# Patient Record
Sex: Female | Born: 1965 | Race: White | Hispanic: No | Smoking: Former smoker
Health system: Southern US, Community
[De-identification: ages and names within clinical notes are randomized; demographics above are authoritative.]

## PROBLEM LIST (undated history)

## (undated) DIAGNOSIS — Z87442 Personal history of urinary calculi: Secondary | ICD-10-CM

## (undated) DIAGNOSIS — K219 Gastro-esophageal reflux disease without esophagitis: Secondary | ICD-10-CM

## (undated) DIAGNOSIS — M5136 Other intervertebral disc degeneration, lumbar region: Secondary | ICD-10-CM

## (undated) DIAGNOSIS — E785 Hyperlipidemia, unspecified: Secondary | ICD-10-CM

## (undated) DIAGNOSIS — D122 Benign neoplasm of ascending colon: Secondary | ICD-10-CM

## (undated) DIAGNOSIS — I209 Angina pectoris, unspecified: Secondary | ICD-10-CM

## (undated) DIAGNOSIS — M51369 Other intervertebral disc degeneration, lumbar region without mention of lumbar back pain or lower extremity pain: Secondary | ICD-10-CM

## (undated) DIAGNOSIS — T7840XA Allergy, unspecified, initial encounter: Secondary | ICD-10-CM

## (undated) DIAGNOSIS — R0789 Other chest pain: Secondary | ICD-10-CM

## (undated) DIAGNOSIS — E7800 Pure hypercholesterolemia, unspecified: Secondary | ICD-10-CM

## (undated) HISTORY — PX: ABDOMINAL HYSTERECTOMY: SHX81

## (undated) HISTORY — DX: Pure hypercholesterolemia, unspecified: E78.00

## (undated) HISTORY — DX: Allergy, unspecified, initial encounter: T78.40XA

---

## 1998-05-25 HISTORY — PX: ABDOMINAL HYSTERECTOMY: SHX81

## 2011-07-07 ENCOUNTER — Ambulatory Visit: Payer: Self-pay | Admitting: Family Medicine

## 2011-07-16 ENCOUNTER — Ambulatory Visit: Payer: Self-pay | Admitting: Family Medicine

## 2012-01-07 ENCOUNTER — Ambulatory Visit: Payer: Self-pay | Admitting: Family Medicine

## 2012-01-14 ENCOUNTER — Ambulatory Visit: Payer: Self-pay

## 2012-01-14 HISTORY — PX: BREAST BIOPSY: SHX20

## 2012-07-13 ENCOUNTER — Ambulatory Visit: Payer: Self-pay

## 2013-03-03 ENCOUNTER — Encounter: Payer: Self-pay | Admitting: General Surgery

## 2013-03-08 ENCOUNTER — Other Ambulatory Visit: Payer: PRIVATE HEALTH INSURANCE

## 2013-03-08 ENCOUNTER — Ambulatory Visit (INDEPENDENT_AMBULATORY_CARE_PROVIDER_SITE_OTHER): Payer: PRIVATE HEALTH INSURANCE | Admitting: General Surgery

## 2013-03-08 ENCOUNTER — Encounter: Payer: Self-pay | Admitting: General Surgery

## 2013-03-08 VITALS — BP 104/64 | HR 60 | Resp 12 | Ht 63.0 in | Wt 169.0 lb

## 2013-03-08 DIAGNOSIS — N63 Unspecified lump in unspecified breast: Secondary | ICD-10-CM

## 2013-03-08 NOTE — Patient Instructions (Addendum)
Continue self breast exams. Call office for any new breast issues or concerns. Left mammogram and follow up visit.  Patient has been scheduled for a unilateral left breast diagnostic mammogram at [facility] on 03-10-13 at 8:45 am (ok to arrange per Blima Singer, BCCCP). She is aware of date, time, and instructions. If mammogram is normal, we will have patient to return to the office in two months for follow up.

## 2013-03-08 NOTE — Progress Notes (Signed)
Patient ID: [name], female   DOB: Sep 16, 1965, 47 y.o.   MRN: [id number]  Chief Complaint  Patient presents with  . Breast Problem    lump in left breast    HPI [name] is a 47 y.o. female.  who presents for a breast evaluation. The most recent mammogram was done on Feb 2014.  Patient does perform regular self breast checks and gets regular mammograms done.  She has noticed a lump in left breast for about one month ago that has not changed in size.  Denies pain and no breast trauma.  HPI  Past Medical History  Diagnosis Date  . High cholesterol     Past Surgical History  Procedure Laterality Date  . Breast biopsy Right 01-14-12    History reviewed. No pertinent family history.  Social History History  Substance Use Topics  . Smoking status: Current Every Day Smoker -- 1.00 packs/day for 30 years  . Smokeless tobacco: Never Used  . Alcohol Use: Yes    No Known Allergies  Current Outpatient Prescriptions  Medication Sig Dispense Refill  . aspirin 81 MG tablet Take 81 mg by mouth daily.      Marland Kitchen atorvastatin (LIPITOR) 20 MG tablet Take 20 mg by mouth daily.      . Fish Oil-Cholecalciferol (FISH OIL + D3 PO) Take 1 tablet by mouth daily.       No current facility-administered medications for this visit.    Review of Systems Review of Systems  Constitutional: Negative.   Respiratory: Negative.   Cardiovascular: Negative.     Blood pressure 104/64, pulse 60, resp. rate 12, height 5\' 3"  (1.6 m), weight 169 lb (76.658 kg).  Physical Exam Physical Exam  Constitutional: She is oriented to person, place, and time. She appears well-developed and well-nourished.  Eyes: Conjunctivae are normal. No scleral icterus.  Neck: Neck supple.  Pulmonary/Chest: Right breast exhibits no inverted nipple, no mass, no nipple discharge, no skin change and no tenderness. Left breast exhibits no inverted nipple, no mass, no nipple discharge, no skin change and no tenderness.     Lymphadenopathy:    She has no cervical adenopathy.    She has no axillary adenopathy.  Neurological: She is alert and oriented to person, place, and time.  Skin: Skin is warm and dry.    Data Reviewed Prior pathology from right breast 01-14-12 biopsy.  Assessment    Upper inner quadrant left breast with mild thickening but no defined mass..  Patient points to a location in the midst of this area where she felt a tiny knot, this is not appreciable on examine today.  US done today shows 2 rounded hypo to anechoic masses , 3 and 4 mm size at 10-11 o'cl mid UIQ.  These lekey are benign and can be followed. A left mammogram is indicated. If ok will recheck in 2 mos.  Plan    Left mammogram and follow up visit.     Patient has been scheduled for a unilateral left breast diagnostic mammogram at [facility] on Friday, 03-10-13 at 8:45 am (ok to arrange per Blima Singer, BCCCP). She is aware of date, time, and instructions. If mammogram is normal, we will have patient to return to the office in two months for follow up.    Oliviarose Punch G 03/08/2013, 1:09 PM

## 2013-03-29 ENCOUNTER — Encounter: Payer: Self-pay | Admitting: General Surgery

## 2013-03-29 ENCOUNTER — Ambulatory Visit: Payer: Self-pay | Admitting: General Surgery

## 2013-05-09 ENCOUNTER — Encounter: Payer: Self-pay | Admitting: General Surgery

## 2013-05-09 ENCOUNTER — Other Ambulatory Visit: Payer: PRIVATE HEALTH INSURANCE

## 2013-05-09 ENCOUNTER — Ambulatory Visit (INDEPENDENT_AMBULATORY_CARE_PROVIDER_SITE_OTHER): Payer: PRIVATE HEALTH INSURANCE | Admitting: General Surgery

## 2013-05-09 VITALS — BP 104/70 | HR 64 | Resp 12 | Ht 63.0 in | Wt 168.0 lb

## 2013-05-09 DIAGNOSIS — N63 Unspecified lump in unspecified breast: Secondary | ICD-10-CM

## 2013-05-09 NOTE — Patient Instructions (Signed)
Continue monthly self breast exam. Call for any concerns.

## 2013-05-09 NOTE — Progress Notes (Addendum)
Patient ID: [name], female   DOB: [birth year], 47 y.o.   MRN: [id number]  Chief Complaint  Patient presents with  . Follow-up    2 month follow up breast mass    HPI [name] is a 47 y.o. female who presents for a 2 month follow up of a breast mass. The mass is located in the left breast. The patient checks her breasts regularly and gets regular mammograms done. She denies any new problems with her breasts at this time.  She still feels a tiny lump in left breast. HPI  Past Medical History  Diagnosis Date  . High cholesterol   . Breast mass 2014    Past Surgical History  Procedure Laterality Date  . Breast biopsy Right 01-14-12    History reviewed. No pertinent family history.  Social History History  Substance Use Topics  . Smoking status: Current Every Day Smoker -- 1.00 packs/day for 30 years  . Smokeless tobacco: Never Used  . Alcohol Use: Yes    No Known Allergies  Current Outpatient Prescriptions  Medication Sig Dispense Refill  . aspirin 81 MG tablet Take 81 mg by mouth daily.      Marland Kitchen atorvastatin (LIPITOR) 20 MG tablet Take 20 mg by mouth daily.      . Fish Oil-Cholecalciferol (FISH OIL + D3 PO) Take 1 tablet by mouth daily.       No current facility-administered medications for this visit.    Review of Systems Review of Systems  Constitutional: Negative.   Respiratory: Negative.   Cardiovascular: Negative.     Blood pressure 104/70, pulse 64, resp. rate 12, height 5\' 3"  (1.6 m), weight 168 lb (76.204 kg).  Physical Exam Physical Exam  Constitutional: She is oriented to person, place, and time. She appears well-developed and well-nourished.  Eyes: No scleral icterus.  Pulmonary/Chest: Right breast exhibits no inverted nipple, no mass, no nipple discharge, no skin change and no tenderness. Left breast exhibits mass (1 focal 4 mm thickening  at 11o'clock left breast  3cm from nipple ). Left breast exhibits no inverted nipple, no nipple discharge, no  skin change and no tenderness. Breasts are symmetrical.  Lymphadenopathy:    She has no cervical adenopathy.    She has no axillary adenopathy.  Neurological: She is alert and oriented to person, place, and time.  Skin: Skin is warm and dry.    Data Reviewed Mammogram reviewed-normal. [place] repeated today at 10 and 11 o'cl location. 2 benign appearing rounded nodules seen as noted 2 mos ago. No change in sizes-67mm at 10 ocl and 3 mm at 11 ocl.  Assessment    Stable small nodules in left breast    Plan    Patient to return in three month for left breast u/s-possible biopsy if any changes noted .        [name] G 05/09/2013, 7:06 PM

## 2013-05-31 ENCOUNTER — Ambulatory Visit: Payer: Self-pay

## 2013-06-05 ENCOUNTER — Encounter: Payer: Self-pay | Admitting: General Surgery

## 2013-06-05 ENCOUNTER — Ambulatory Visit (INDEPENDENT_AMBULATORY_CARE_PROVIDER_SITE_OTHER): Payer: PRIVATE HEALTH INSURANCE | Admitting: General Surgery

## 2013-06-05 ENCOUNTER — Other Ambulatory Visit: Payer: PRIVATE HEALTH INSURANCE

## 2013-06-05 VITALS — BP 124/76 | HR 72 | Resp 12 | Ht 63.0 in | Wt 169.0 lb

## 2013-06-05 DIAGNOSIS — N63 Unspecified lump in unspecified breast: Secondary | ICD-10-CM

## 2013-06-05 NOTE — Progress Notes (Signed)
Patient ID: [name], female   DOB: [birth year], 48 y.o.   MRN: [id number]  Chief Complaint  Patient presents with  . Follow-up    left breast     HPI [name] is a 48 y.o. female here today for a left breast ultrasound.  HPI  Past Medical History  Diagnosis Date  . High cholesterol   . Breast mass 2014    Past Surgical History  Procedure Laterality Date  . Breast biopsy Right 01-14-12    History reviewed. No pertinent family history.  Social History History  Substance Use Topics  . Smoking status: Current Every Day Smoker -- 1.00 packs/day for 30 years  . Smokeless tobacco: Never Used  . Alcohol Use: Yes    No Known Allergies  Current Outpatient Prescriptions  Medication Sig Dispense Refill  . aspirin 81 MG tablet Take 81 mg by mouth daily.      Marland Kitchen atorvastatin (LIPITOR) 20 MG tablet Take 20 mg by mouth daily.      . Fish Oil-Cholecalciferol (FISH OIL + D3 PO) Take 1 tablet by mouth daily.       No current facility-administered medications for this visit.    Review of Systems Review of Systems  Constitutional: Negative.   Respiratory: Negative.   Cardiovascular: Negative.     Blood pressure 124/76, pulse 72, resp. rate 12, height 5\' 3"  (1.6 m), weight 169 lb (76.658 kg).  Physical Exam Physical Exam  Constitutional: She is oriented to person, place, and time. She appears well-developed and well-nourished.  Eyes: Conjunctivae are normal. No scleral icterus.  Neck: Neck supple.  Pulmonary/Chest: Right breast exhibits no inverted nipple, no mass, no nipple discharge, no skin change and no tenderness. Left breast exhibits no inverted nipple, no nipple discharge, no skin change and no tenderness.  Lymphadenopathy:    She has no cervical adenopathy.    She has no axillary adenopathy.  Neurological: She is alert and oriented to person, place, and time.  Skin: Skin is warm and dry.    Data Reviewed Mammogram reviewed   Assessment     performed left   ultrasound shows stable nodules  at 10 and 11 o'clock. Plan    Patient to return in 6 months left breast ultrasound and exam.       Cherylann Hobday G 06/07/2013, 7:40 AM

## 2013-06-05 NOTE — Patient Instructions (Signed)
Patient to return in 6  Months left breast ultrasound and exam. Patient to continue to do self breast chest monthly.

## 2013-06-07 ENCOUNTER — Encounter: Payer: Self-pay | Admitting: General Surgery

## 2013-08-07 ENCOUNTER — Ambulatory Visit: Payer: PRIVATE HEALTH INSURANCE | Admitting: General Surgery

## 2013-08-20 IMAGING — MG MM ADDITIONAL VIEWS AT NO CHARGE
1 series · 7 of 7 positions shown · non-contrast
Comparison: none

REASON FOR EXAM: av bilat nodular densities
COMMENTS:

PROCEDURE:     MAM - MAM DGTL  ADD VW  BIL SCR  - July 16, 2011 [DATE]
RESULT:

[R ML · right · 7 of 7 slices shown]
[im 1/7]
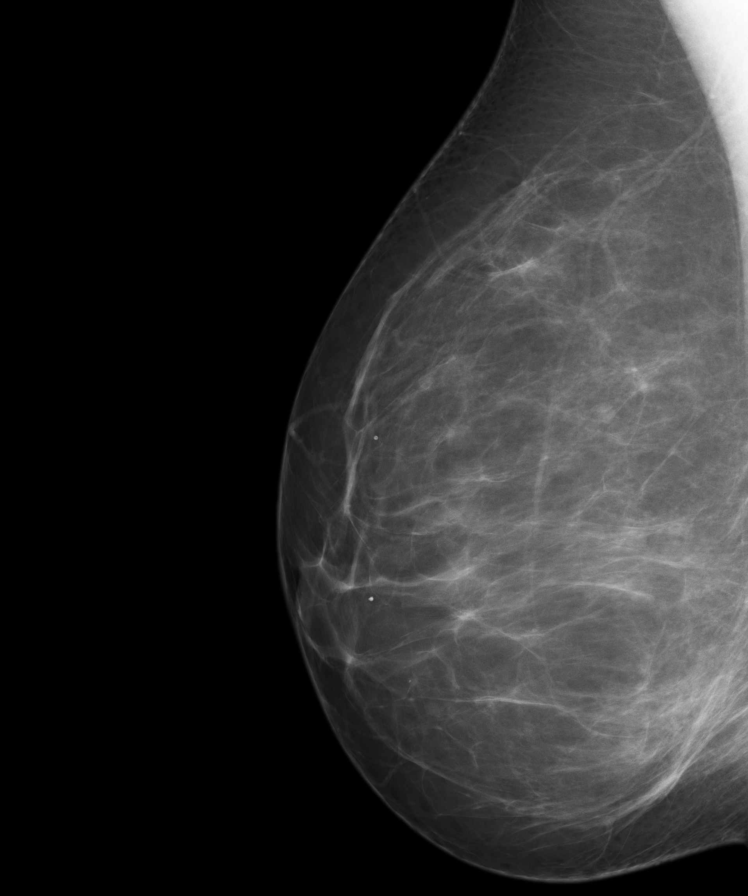
[im 2/7]
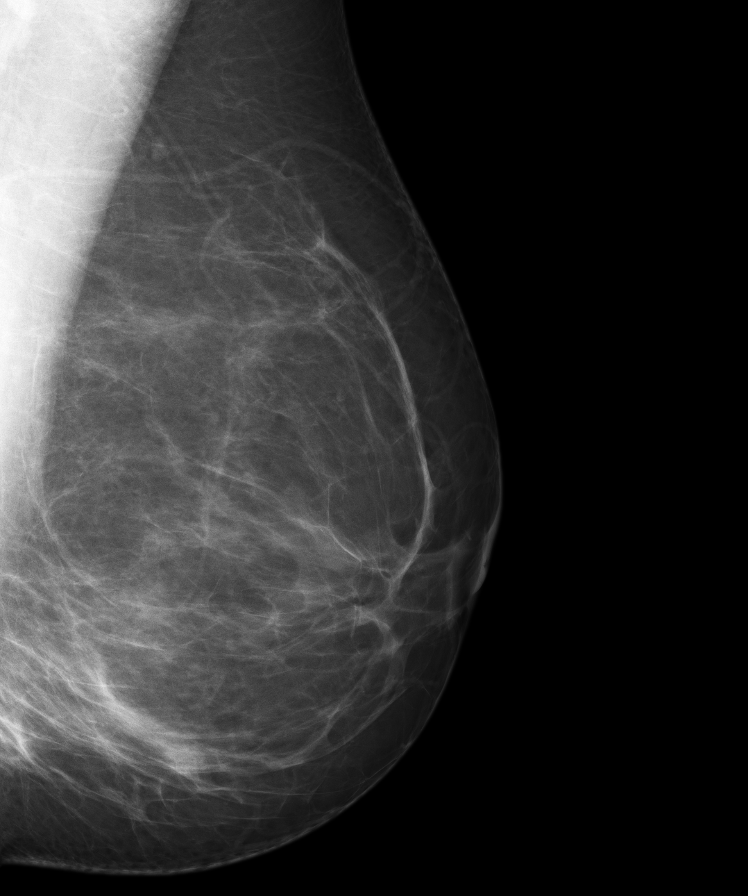
[im 3/7]
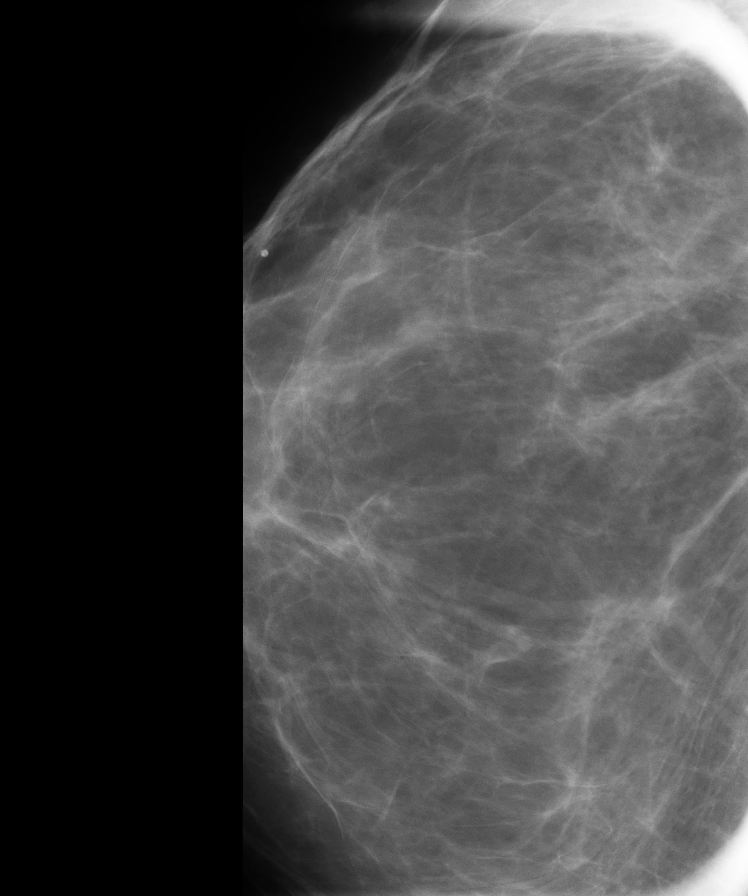
[im 4/7]
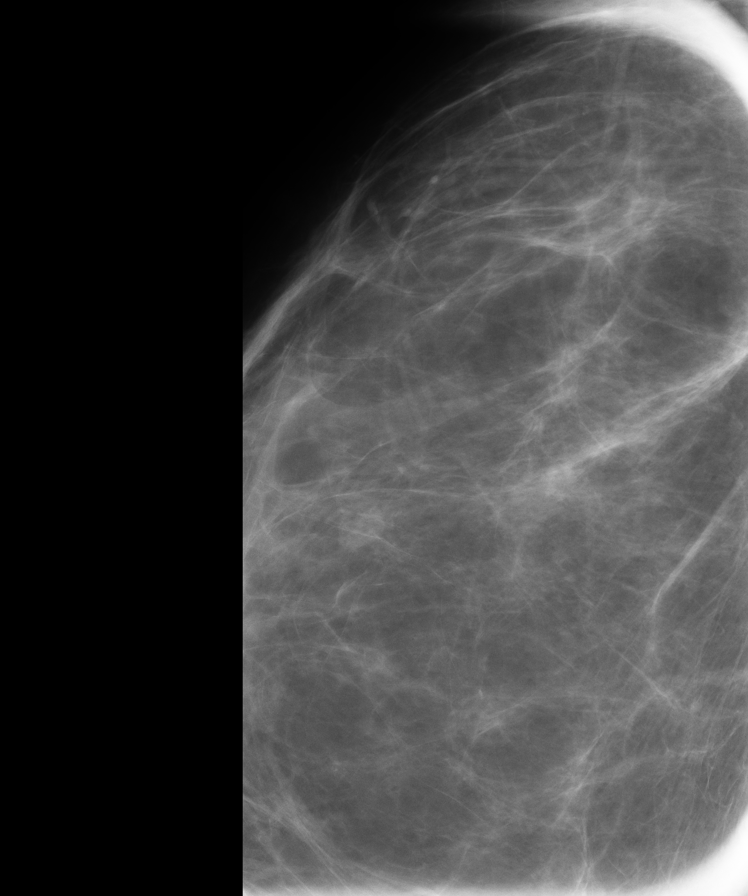
[im 5/7]
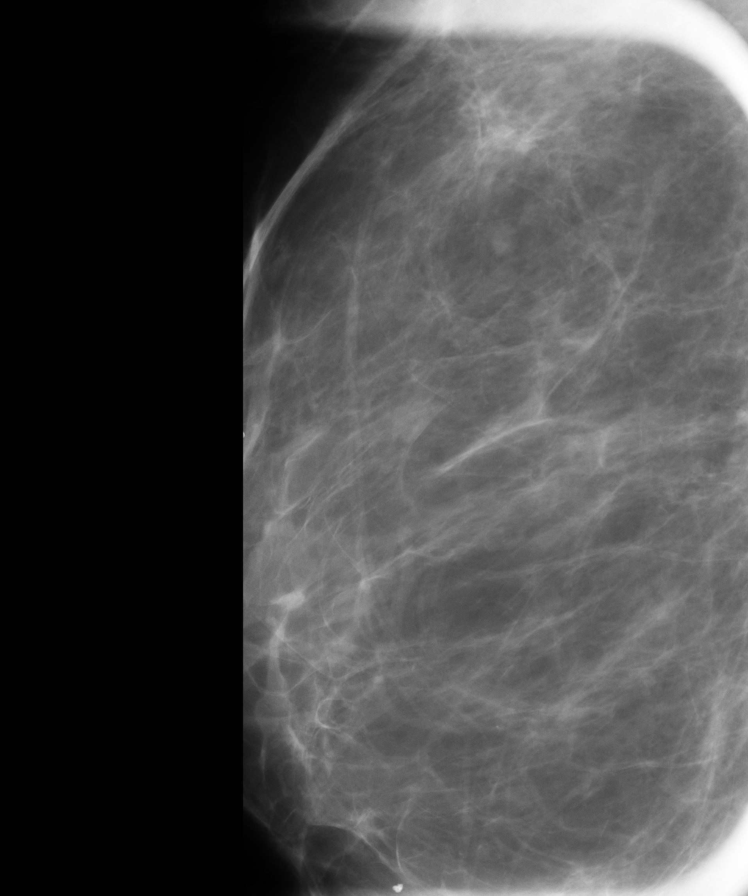
[im 6/7]
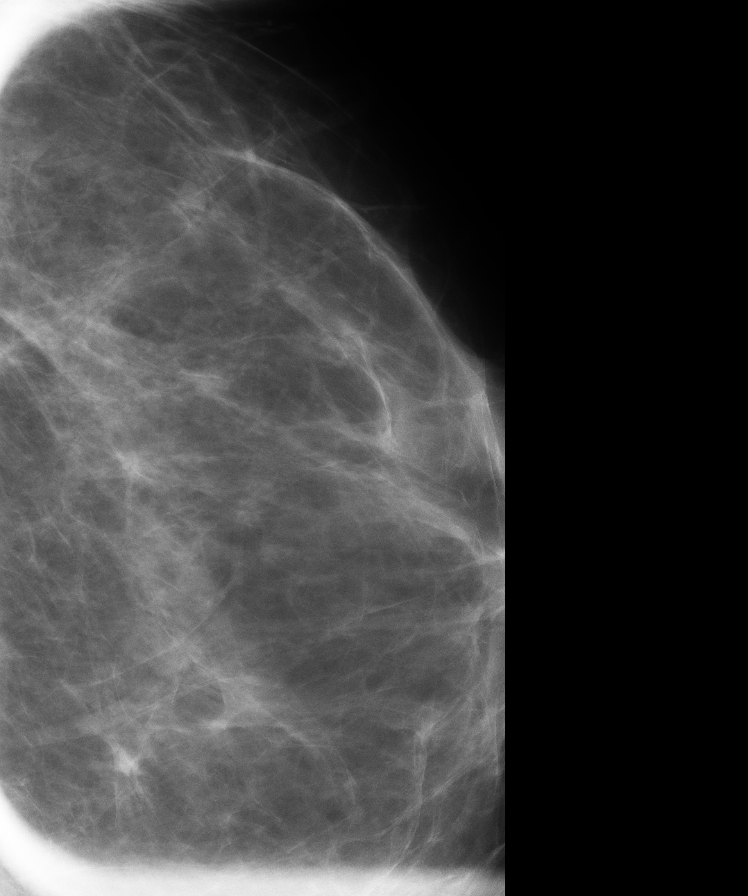
[im 7/7]
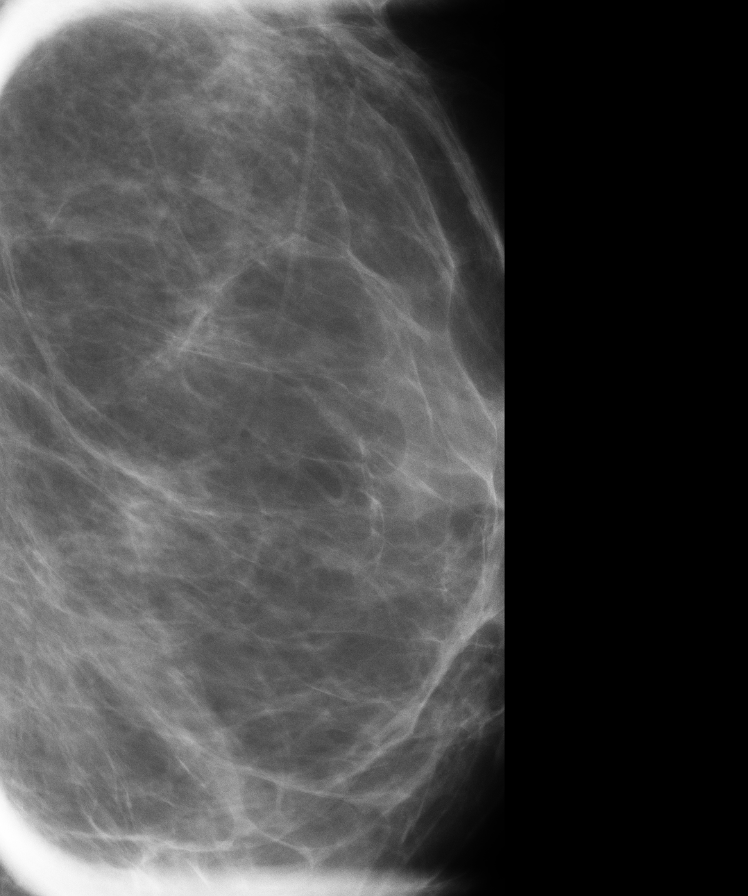

[7 of 7 positions shown; findings below may reference images not displayed]

FINDINGS: The region of interest within the left breast is further evaluated
with magnification compression imaging. This area effaces with compression.

The region of interest within the right breast was further evaluated and
appears to persist. This area is small and demonstrates primarily smoothly
marginated borders. Sonographic evaluation of this region demonstrates
sonographic findings which are too small to characterize though likely
represent a small, benign cyst. There is no further radiographic or
sonographic evidence to suggest malignancy.
IMPRESSION: BI-RADS: Category 3 - Probably Benign Finding (interval follow-up).

RECOMMENDATION:  Likely benign finding, right breast. Six month
re-evaluation recommended.

Thank you for this opportunity to contribute to the care of your patient.

A NEGATIVE MAMMOGRAM REPORT DOES NOT PRECLUDE BIOPSY OR OTHER EVALUATION OF
A CLINICALLY PALPABLE OR OTHERWISE SUSPICIOUS MASS OR LESION. BREAST CANCER
MAY NOT BE DETECTED BY MAMMOGRAPHY IN UP TO 10% OF CASES.

## 2013-10-19 ENCOUNTER — Ambulatory Visit (INDEPENDENT_AMBULATORY_CARE_PROVIDER_SITE_OTHER): Payer: PRIVATE HEALTH INSURANCE | Admitting: General Surgery

## 2013-10-19 ENCOUNTER — Encounter: Payer: Self-pay | Admitting: General Surgery

## 2013-10-19 ENCOUNTER — Other Ambulatory Visit: Payer: PRIVATE HEALTH INSURANCE

## 2013-10-19 VITALS — BP 130/80 | HR 76 | Resp 14 | Ht 63.0 in | Wt 152.0 lb

## 2013-10-19 DIAGNOSIS — N63 Unspecified lump in unspecified breast: Secondary | ICD-10-CM

## 2013-10-19 NOTE — Patient Instructions (Signed)
Continue self breast exams. Call office for any new breast issues or concerns. 

## 2013-10-19 NOTE — Progress Notes (Signed)
Patient ID: [name], female   DOB: 26-Sep-1965, 48 y.o.   MRN: [id number]  Chief Complaint  Patient presents with  . Follow-up    breast check    HPI [name] is a 48 y.o. female here today for a breast check. Patient states the re is a red spot in her left breast looks like the skin is turning brown . This is in the vicinity of the breast mass seen with [place] before. She states she noticed this about two weeks ago. HPI  Past Medical History  Diagnosis Date  . High cholesterol   . Breast mass 2014    Past Surgical History  Procedure Laterality Date  . Breast biopsy Right 01-14-12    History reviewed. No pertinent family history.  Social History History  Substance Use Topics  . Smoking status: Current Every Day Smoker -- 1.00 packs/day for 30 years  . Smokeless tobacco: Never Used  . Alcohol Use: Yes    No Known Allergies  Current Outpatient Prescriptions  Medication Sig Dispense Refill  . aspirin 81 MG tablet Take 81 mg by mouth daily.      Marland Kitchen atorvastatin (LIPITOR) 20 MG tablet Take 20 mg by mouth daily.      . Fish Oil-Cholecalciferol (FISH OIL + D3 PO) Take 1 tablet by mouth daily.       No current facility-administered medications for this visit.    Review of Systems Review of Systems  Constitutional: Negative.   Respiratory: Negative.   Cardiovascular: Negative.     Blood pressure 130/80, pulse 76, resp. rate 14, height 5\' 3"  (1.6 m), weight 152 lb (68.947 kg).  Physical Exam Physical Exam Their is a 2 mm reddish spot on the skin upper inner left breast. The skin its self is not thickened. The located of this is not near where the lump was seen on ultrasound. No lymphadenopathy in neck or axillae.  Right breast with no findings. Data Reviewed [place] repeated today-no findings at site of new skin spot. The 2 nodules closer to the areola appear unchanged to smaller.  Assessment    Pt reassured-skin spot is not related to the nodules see by US-these appear  stable to smaller.    Plan   Patient to return  In January 2016.        Seeplaputhur G Sankar 10/19/2013, 11:12 AM

## 2014-03-26 ENCOUNTER — Encounter: Payer: Self-pay | Admitting: General Surgery

## 2014-06-18 ENCOUNTER — Ambulatory Visit: Payer: Self-pay

## 2015-04-13 IMAGING — US US OUTSIDE FILMS BREAST
1 series · 11 of 11 positions shown · non-contrast
Comparison: none

[Series 1: us outside films breast · 11 of 11 slices shown]
[im 1/11]
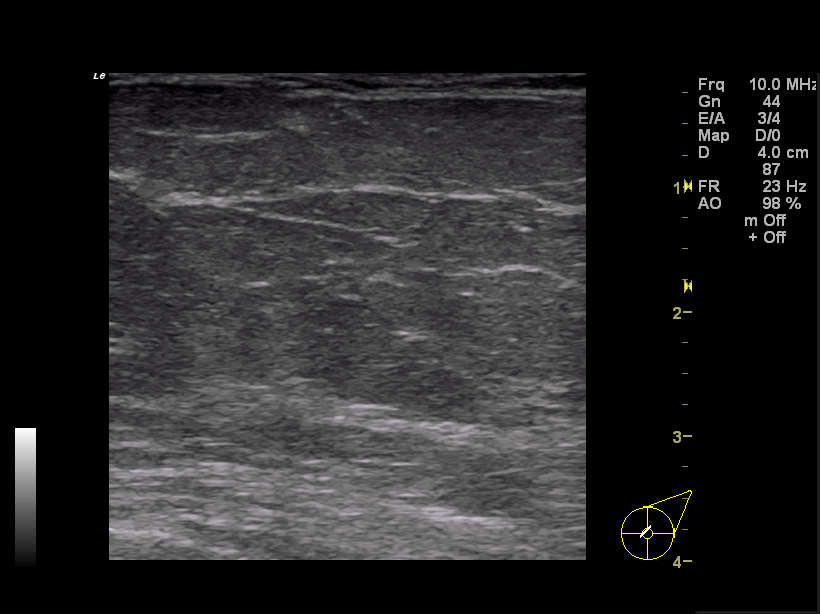
[im 2/11]
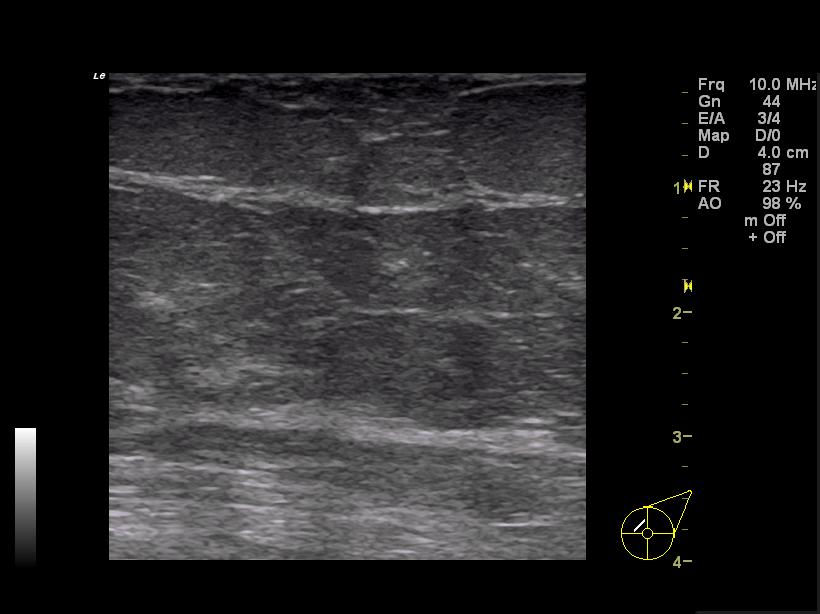
[im 3/11]
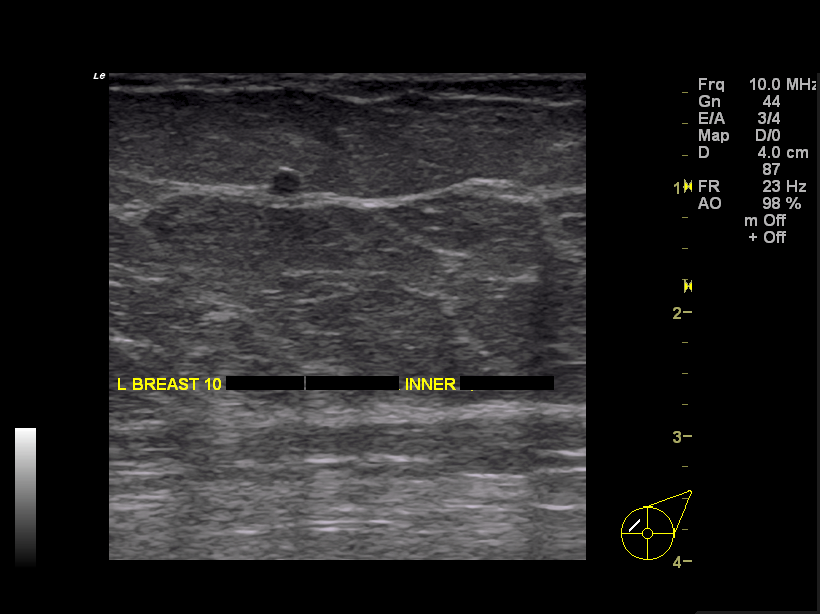
[im 4/11]
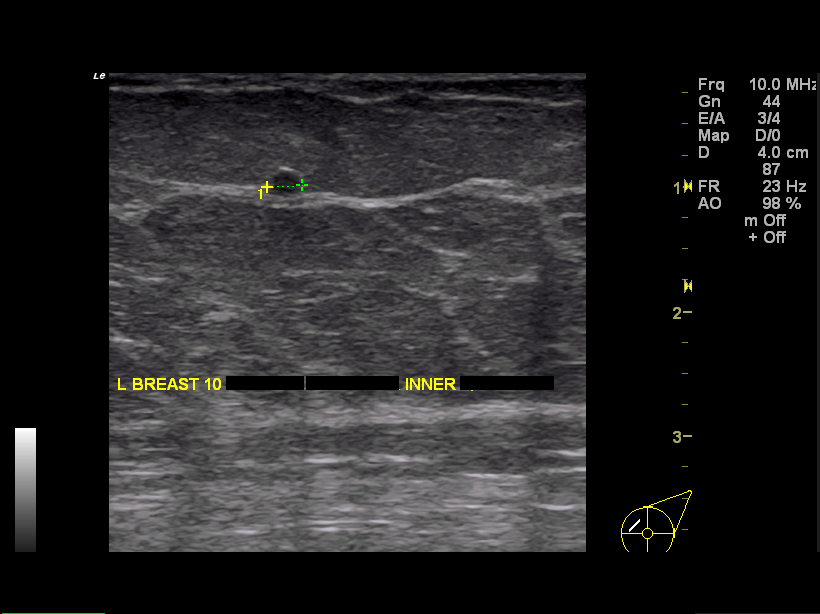
[im 5/11]
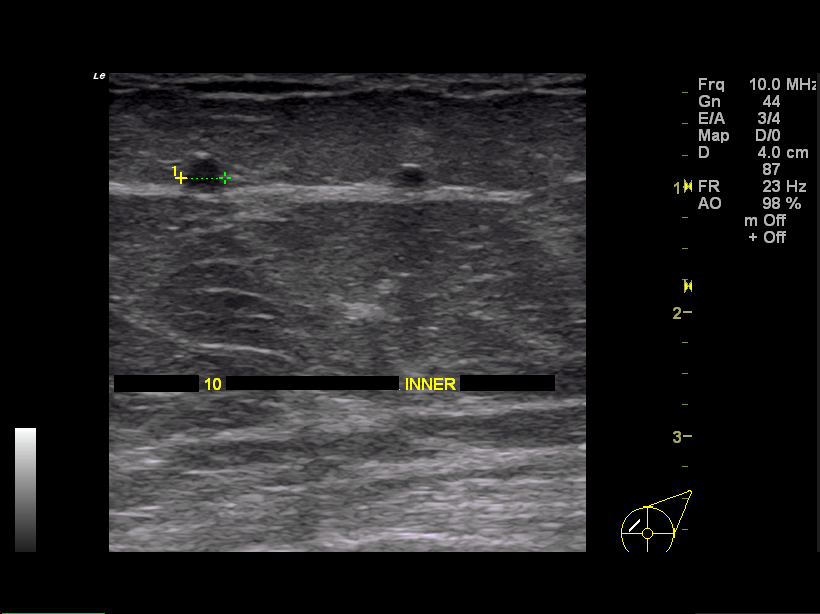
[im 6/11]
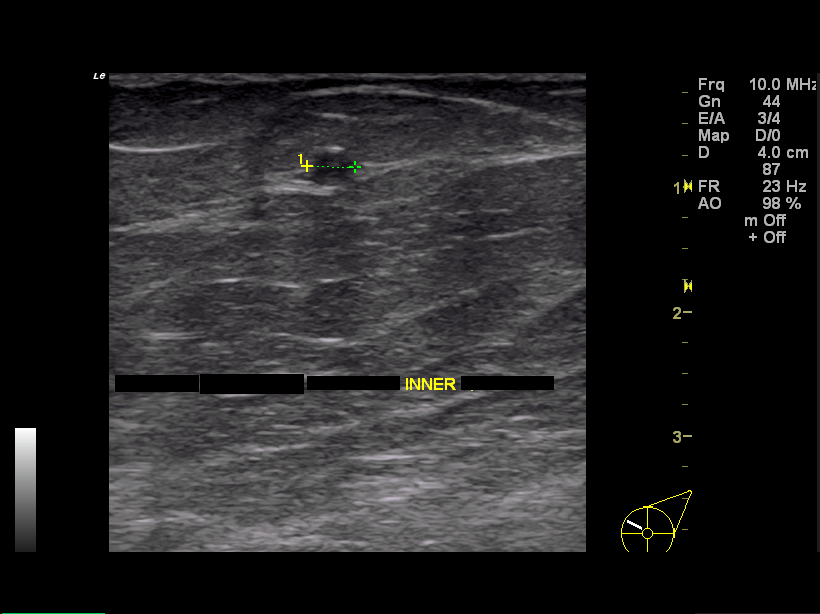
[im 7/11]
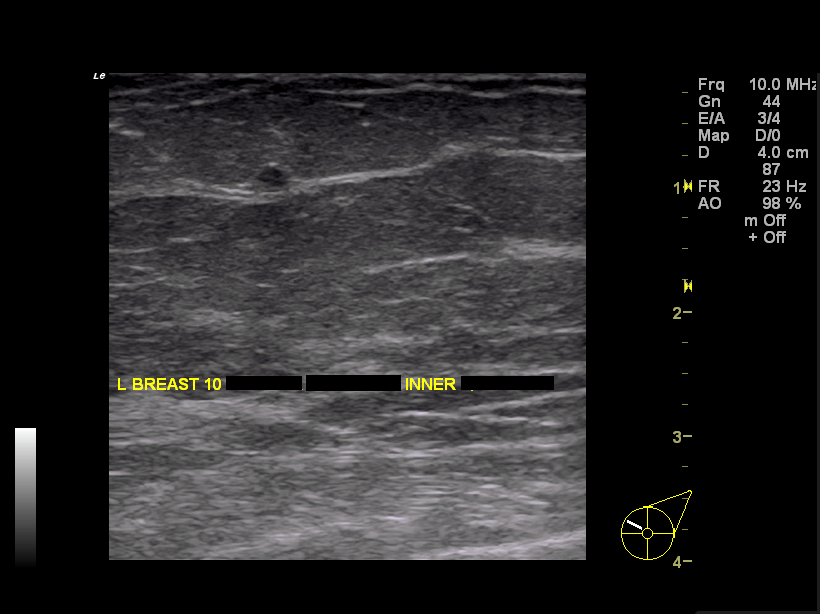
[im 8/11]
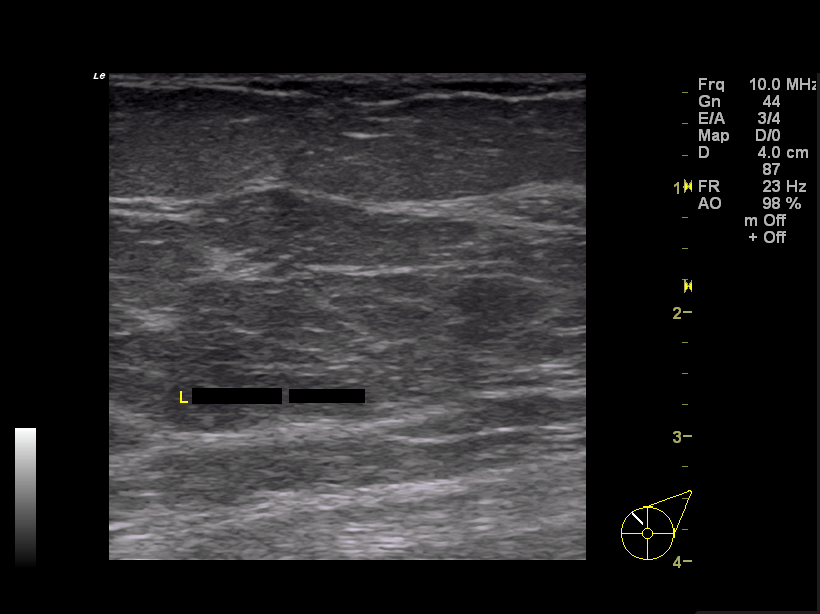
[im 9/11]
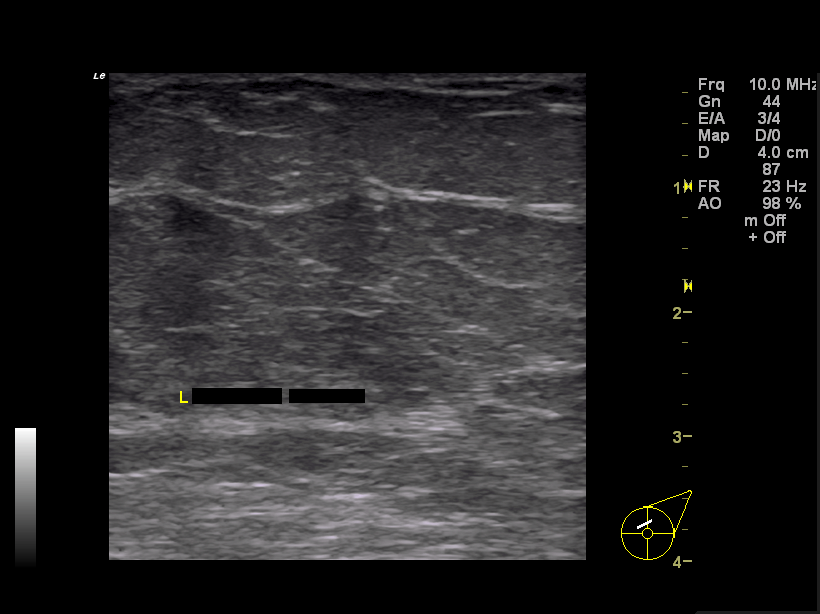
[im 10/11]
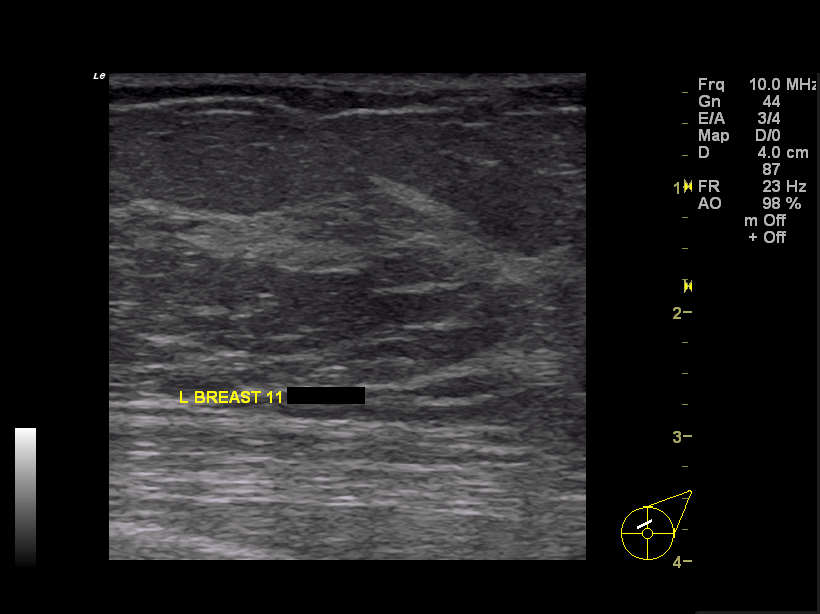
[im 11/11]
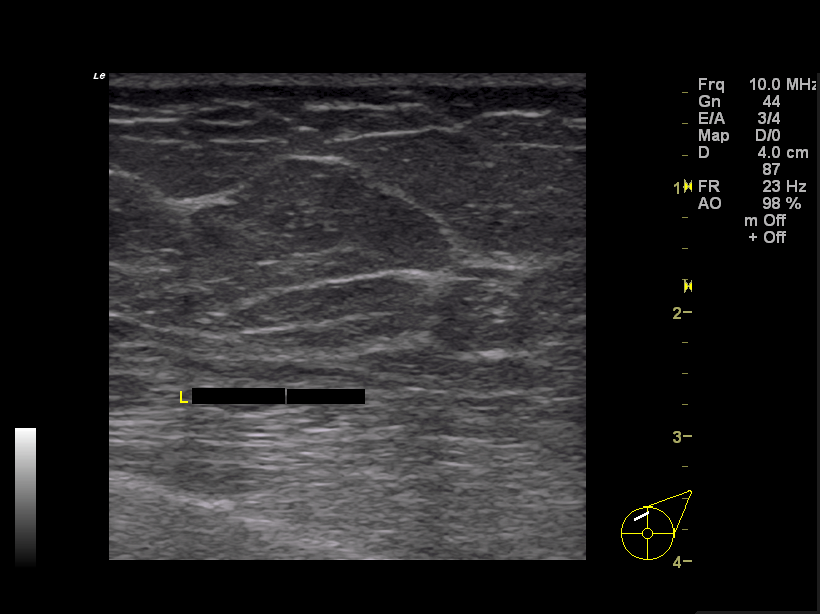

[11 of 11 positions shown; findings below may reference images not displayed]

IMAGES IMPORTED FROM THE SYN[employer] WORKFLOW SYSTEM
NO DICTATION FOR STUDY

## 2016-02-06 ENCOUNTER — Ambulatory Visit (INDEPENDENT_AMBULATORY_CARE_PROVIDER_SITE_OTHER): Payer: Self-pay | Admitting: Family Medicine

## 2016-02-06 ENCOUNTER — Encounter: Payer: Self-pay | Admitting: Family Medicine

## 2016-02-06 VITALS — BP 103/68 | HR 63 | Temp 97.7°F | Resp 16 | Ht 63.0 in | Wt 160.5 lb

## 2016-02-06 DIAGNOSIS — S29012A Strain of muscle and tendon of back wall of thorax, initial encounter: Secondary | ICD-10-CM

## 2016-02-06 MED ORDER — CYCLOBENZAPRINE HCL 5 MG PO TABS: 5.0000 mg | ORAL_TABLET | Freq: Three times a day (TID) | ORAL | 0 refills | Status: DC | PRN

## 2016-02-06 MED ORDER — NAPROXEN 500 MG PO TABS: 500.0000 mg | ORAL_TABLET | Freq: Two times a day (BID) | ORAL | 0 refills | Status: DC

## 2016-02-06 NOTE — Progress Notes (Signed)
Subjective:    Patient ID: [name], female    DOB: [birth year], 50 y.o.   MRN: [id number]  [name] is a 50 y.o. female presenting on 02/06/2016 for Chest Pain (lift up  50 pound chicken and ribs are hurting eversince as per pt)   HPI   RIGHT MID BACK / RIB PAIN: - Reports symptoms started about 6 days ago, working at home putting in a new road by her home, slid and caught herself, seemed to strain her left side of her ribs, but slowly improved. Next day lifted a 50 lb chicken feed and hurt right side of mid back and front ribs and side. Course without significant improvement, without worsening. 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 Improved laying down with heating pad. Tried Ibuprofen 800mg  once daily night time, no other treatments. - Admits to pain worse with deep breaths, some movements turning/twist - Prior history of lumbar disc herniation years ago resolved, no other prior back or muscle injury, no prior back surgery or PT - Denies any fevers/chills, rash, fall or trauma, numbness, tingling, weakness or radiating pain, shortness of breath, cough, bowel or bladder incontinence or loss of control, saddle anesthesia   Social History  Substance Use Topics  . Smoking status: Current Every Day Smoker    Packs/day: 1.00    Years: 30.00  . Smokeless tobacco: Never Used  . Alcohol use Yes    Review of Systems Per HPI unless specifically indicated above     Objective:    BP 103/68   Pulse 63   Temp 97.7 F (36.5 C) (Oral)   Resp 16   Ht 5\' 3"  (1.6 m)   Wt 160 lb 8 oz (72.8 kg)   BMI 28.43 kg/m   Wt Readings from Last 3 Encounters:  02/06/16 160 lb 8 oz (72.8 kg)  10/19/13 152 lb (68.9 kg)  06/05/13 169 lb (76.7 kg)    Physical Exam  Constitutional: She is oriented to person, place, and time. She appears well-developed and well-nourished. No distress.  Well-appearing,  comfortable, cooperative  Neck: Normal range of motion. Neck supple.  Pulmonary/Chest: Effort normal and breath sounds normal. No respiratory distress. She has no wheezes. She has no rales. She exhibits tenderness (Right anterior lower ribs).  Musculoskeletal: She exhibits no edema.  Thoracic / Mid Back Inspection: Normal appearance, no spinal deformity, symmetrical. Palpation: No tenderness over spinous processes. Right thoracic (T4-T10) paraspinal muscles tender and with hypertonicity/spasm. ROM: Mostly full active ROM forward flex / back extension, rotation L/R without significant discomfort Special Testing: Seated SLR negative for radicular pain bilaterally Strength: Bilateral hip flex/ext 5/5, knee flex/ext 5/5, ankle dorsiflex/plantarflex 5/5 Neurovascular: intact distal sensation to light touch  Neurological: She is alert and oriented to person, place, and time.  Gait normal.  Skin: Skin is warm and dry. She is not diaphoretic.  Nursing note and vitals reviewed.      Assessment & Plan:   Problem List Items Addressed This Visit    Muscle strain of right upper back - Primary    Acute x 6 day R thoracic/mid back / anterior rib/chest wall pain without radiation or sciatica. History of chronic low back DJD, prior herniated disc. No prior surgery or complications. - No red flag symptoms. - Improved with heating pad and some therapy, but persistent symptoms  Plan: 1. Start anti-inflammatory trial with rx  Naprosyn 500mg  BID wc x 2 weeks, then PRN 2. Start muscle relaxant with Flexeril 5mg  tabs TID PRN 3. May use Tylenol PRN for breakthrough 4. Encouraged use of heating pad 1-2x daily for now then PRN 5. Demonstrated OMT with Myofascial release of Rib raising, discussed, advised of potential rebound pain, consented, tolerated techniques well with significant improvement. 6. Follow-up as scheduled 1 month for physical, if back not improving or worsening, re-evaluate, consider imaging vs  additional exercises / PT      Relevant Medications   naproxen (NAPROSYN) 500 MG tablet   cyclobenzaprine (FLEXERIL) 5 MG tablet    Other Visit Diagnoses   None.     Meds ordered this encounter  Medications  . naproxen (NAPROSYN) 500 MG tablet    Sig: Take 1 tablet (500 mg total) by mouth 2 (two) times daily with a meal.    Dispense:  30 tablet    Refill:  0  . cyclobenzaprine (FLEXERIL) 5 MG tablet    Sig: Take 1 tablet (5 mg total) by mouth 3 (three) times daily as needed for muscle spasms.    Dispense:  30 tablet    Refill:  0   Follow up plan: Return in about 4 weeks (around 03/05/2016) for back pain / physical.  Nobie Putnam, DO [facility] 02/06/2016, 12:07 PM

## 2016-02-06 NOTE — Assessment & Plan Note (Signed)
Acute x 6 day R thoracic/mid back / anterior rib/chest wall pain without radiation or sciatica. History of chronic low back DJD, prior herniated disc. No prior surgery or complications. - No red flag symptoms. - Improved with heating pad and some therapy, but persistent symptoms  Plan: 1. Start anti-inflammatory trial with rx Naprosyn 500mg  BID wc x 2 weeks, then PRN 2. Start muscle relaxant with Flexeril 5mg  tabs TID PRN 3. May use Tylenol PRN for breakthrough 4. Encouraged use of heating pad 1-2x daily for now then PRN 5. Demonstrated OMT with Myofascial release of Rib raising, discussed, advised of potential rebound pain, consented, tolerated techniques well with significant improvement. 6. Follow-up as scheduled 1 month for physical, if back not improving or worsening, re-evaluate, consider imaging vs additional exercises / PT

## 2016-02-06 NOTE — Patient Instructions (Signed)
Thank you for coming in to clinic today.  1. For your Back Pain - I think that this is due to Muscle Spasms or strain. 2. Start with anti-inflammatory Naprosyn (Naproxen) 500mg  twice daily (12 hrs apart, with food, breakfast and dinner) every day for next 2 to 4 weeks if helping, then can use only as needed 3. Start Cyclobenzapine (Flexeril) 10mg  tablets - cut in half for 5mg  at night for muscle relaxant - may make you sedated or sleepy (be careful driving or working on this) if tolerated you can take every 8 hours, half or whole tab 4. May use Tylenol Extra Str 500mg  tabs - may take 1-2 tablets every 6 hours as needed 5. Recommend to start using heating pad on your lower back 1-2x daily for few weeks  Try the muscle massage technique as demonstrated every other day for a few days then stop  This pain may take weeks to months to fully resolve, but hopefully it will respond to the medicine initially. All back injuries (small or serious) are slow to heal since we use our back muscles every day. Be careful with turning, twisting, lifting, sitting / standing for prolonged periods, and avoid re-injury.  If your symptoms significantly worsen with more pain, or new symptoms with weakness in one or both legs, new or different shooting leg pains, numbness in legs or groin, loss of control or retention of urine or bowel movements, please call back for advice and you may need to go directly to the Emergency Department.  Follow-up as scheduled in October for physical, sooner if needed for back pain.  If you have any other questions or concerns, please feel free to call the clinic or send a message through [facility]. You may also schedule an earlier appointment if necessary.  Nobie Putnam, DO [facility]

## 2016-02-27 ENCOUNTER — Other Ambulatory Visit: Payer: Self-pay

## 2016-02-27 ENCOUNTER — Ambulatory Visit: Payer: PRIVATE HEALTH INSURANCE | Admitting: General Surgery

## 2016-03-02 ENCOUNTER — Other Ambulatory Visit: Payer: Self-pay

## 2016-03-02 ENCOUNTER — Encounter: Payer: Self-pay | Admitting: Family Medicine

## 2016-03-05 ENCOUNTER — Encounter: Payer: Self-pay | Admitting: Family Medicine

## 2016-03-18 ENCOUNTER — Other Ambulatory Visit: Payer: Self-pay

## 2016-04-09 ENCOUNTER — Encounter: Payer: Self-pay | Admitting: Family Medicine

## 2016-04-09 ENCOUNTER — Ambulatory Visit (INDEPENDENT_AMBULATORY_CARE_PROVIDER_SITE_OTHER): Payer: Self-pay | Admitting: General Surgery

## 2016-04-09 ENCOUNTER — Encounter: Payer: Self-pay | Admitting: General Surgery

## 2016-04-09 ENCOUNTER — Ambulatory Visit (INDEPENDENT_AMBULATORY_CARE_PROVIDER_SITE_OTHER): Payer: Self-pay | Admitting: Family Medicine

## 2016-04-09 ENCOUNTER — Inpatient Hospital Stay: Payer: Self-pay

## 2016-04-09 VITALS — BP 128/72 | HR 76 | Resp 14 | Ht 63.0 in | Wt 168.0 lb

## 2016-04-09 VITALS — BP 112/70 | HR 67 | Temp 97.9°F | Resp 16 | Ht 63.0 in | Wt 166.4 lb

## 2016-04-09 DIAGNOSIS — F4320 Adjustment disorder, unspecified: Secondary | ICD-10-CM | POA: Insufficient documentation

## 2016-04-09 DIAGNOSIS — R7309 Other abnormal glucose: Secondary | ICD-10-CM

## 2016-04-09 DIAGNOSIS — Z114 Encounter for screening for human immunodeficiency virus [HIV]: Secondary | ICD-10-CM

## 2016-04-09 DIAGNOSIS — E782 Mixed hyperlipidemia: Secondary | ICD-10-CM

## 2016-04-09 DIAGNOSIS — N6320 Unspecified lump in the left breast, unspecified quadrant: Secondary | ICD-10-CM

## 2016-04-09 DIAGNOSIS — F17211 Nicotine dependence, cigarettes, in remission: Secondary | ICD-10-CM

## 2016-04-09 DIAGNOSIS — Z131 Encounter for screening for diabetes mellitus: Secondary | ICD-10-CM

## 2016-04-09 DIAGNOSIS — E785 Hyperlipidemia, unspecified: Secondary | ICD-10-CM | POA: Insufficient documentation

## 2016-04-09 DIAGNOSIS — F17200 Nicotine dependence, unspecified, uncomplicated: Secondary | ICD-10-CM | POA: Insufficient documentation

## 2016-04-09 DIAGNOSIS — Z0000 Encounter for general adult medical examination without abnormal findings: Secondary | ICD-10-CM

## 2016-04-09 DIAGNOSIS — F4323 Adjustment disorder with mixed anxiety and depressed mood: Secondary | ICD-10-CM

## 2016-04-09 DIAGNOSIS — Z1211 Encounter for screening for malignant neoplasm of colon: Secondary | ICD-10-CM

## 2016-04-09 LAB — COMPLETE METABOLIC PANEL WITH GFR
ALBUMIN: 4.2 g/dL (ref 3.6–5.1)
ALK PHOS: 66 U/L (ref 33–130)
ALT: 26 U/L (ref 6–29)
AST: 22 U/L (ref 10–35)
BUN: 18 mg/dL (ref 7–25)
CALCIUM: 9.3 mg/dL (ref 8.6–10.4)
CO2: 26 mmol/L (ref 20–31)
Chloride: 105 mmol/L (ref 98–110)
Creat: 0.62 mg/dL (ref 0.50–1.05)
Glucose, Bld: 81 mg/dL (ref 65–99)
POTASSIUM: 4.2 mmol/L (ref 3.5–5.3)
Sodium: 141 mmol/L (ref 135–146)
Total Bilirubin: 0.5 mg/dL (ref 0.2–1.2)
Total Protein: 6.7 g/dL (ref 6.1–8.1)

## 2016-04-09 LAB — LIPID PANEL
CHOL/HDL RATIO: 6.1 ratio — AB (ref <5.0)
CHOLESTEROL: 194 mg/dL (ref <200)
HDL: 32 mg/dL — AB (ref >50)
LDL Cholesterol: 120 mg/dL — ABNORMAL HIGH (ref <100)
Triglycerides: 210 mg/dL — ABNORMAL HIGH (ref <150)
VLDL: 42 mg/dL — ABNORMAL HIGH (ref <30)

## 2016-04-09 NOTE — Assessment & Plan Note (Signed)
Reviewed last lipid panel 2016, mild low HDL and inc TG >180, concern with reduced healthy lifestyle currently - Check fasting lipids, follow-up, ASCVD risk, determine if need to resume lipitor or statin therapy

## 2016-04-09 NOTE — Patient Instructions (Addendum)
The patient is aware to call back for any questions or concerns. Patient will be asked to return to the office in 2 months with a bilateral screening mammogram.

## 2016-04-09 NOTE — Patient Instructions (Addendum)
Thank you for coming in to clinic today.  1. We will check all blood work today as discussed, and send you a MyChart message with results and plan, we will discuss if need to resume Lipitor or similar medication.  2. Let me know if you need any further assistance or guidance with regards to any depressive mood or anxiety symptoms, I think a lot of this is very normal process and you seem to be doing well overall.  Sleep Hygiene Recommendations to promote healthy sleep in all patients, especially if symptoms of insomnia are worsening. Due to the nature of sleep rhythms, if your body gets "out of rhythm", it may take some time before your sleep cycle can be "reset".  Please try to follow as many of the following tips as you can, usually there are only a few of these are the primary cause of the problem.  ?To reset your sleep rhythm, go to bed and get up at the same time every day ?Sleep only long enough to feel rested and then get out of bed ?Do not try to force yourself to sleep. If you can't sleep, get out of bed and try again later.  ?Have coffee, tea, and other foods that have caffeine only in the morning ?Exercise several days a week, but not right before bed ?If you drink alcohol, avoid alcohol in the late afternoon, evening, and bedtime ?If you smoke, avoid smoking, especially in the evening  ?Avoid watching TV or looking at phones, computers, or reading devices ("e-books") that give off light at least 30 minutes before bed. This artificial light sends "awake signals" to your brain and can make it harder to fall asleep. ?Keep your bedroom dark, cool, quiet, and free of reminders of other things that cause you stress ?Try your best to solve or at least address your problems before you go to bed  Colon Cancer Screening: - For all adults age 50+ routine colon cancer screening is highly recommended. - Early detection of colon cancer is important, because often there are no warning signs or  symptoms, also if found early usually it can be cured. Late stage is hard to treat.  - If you are not interested in Colonoscopy screening (if done and normal you could be cleared for 5 to 10 years until next due), then Cologuard is an excellent alternative for screening test for Colon Cancer. It is highly sensitive for detecting DNA of colon cancer from even the earliest stages. Also, there is NO bowel prep required. - If Cologuard is NEGATIVE, then it is good for 3 years before next due - If Cologuard is POSITIVE, then it is strongly advised to get a Colonoscopy, which allows the GI doctor to locate the source of the cancer or polyp (even very early stage) and treat it by removing it. ------------------------- If you would like to proceed with Cologuard (stool DNA test) - FIRST, call your insurance company and tell them you want to check cost of Cologuard tell them CPT Code 81528 (it may be completely covered and you could get for no cost, OR max cost without any coverage is about $600). Also, keep in mind if you do NOT open the kit, and decide not to do the test, you will NOT be charged, you should contact the company if you decide not to do the test. - If you want to proceed, you can notify us (phone message, MyChart Message, or at next visit) and we will order it for   you. The test kit will be delivered to you house within about 1 week. Follow instructions to collect sample, you may call the company for any help or questions, 24/7 telephone support at [phone].   Please schedule a follow-up appointment with Dr. Karamalegos in 1 year for Annual Physical  If you have any other questions or concerns, please feel free to call the clinic or send a message through MyChart. You may also schedule an earlier appointment if necessary.  Alexander Karamalegos, DO [facility], [facility]  

## 2016-04-09 NOTE — Assessment & Plan Note (Signed)
Due for routine colon cancer screening. Never had colonoscopy (not interested), no family history colon cancer. - Discussion today about recommendations for either Colonoscopy or Cologuard screening, benefits and risks of screening, interested in Cologuard, understands that if positive then recommendation is for diagnostic colonoscopy to follow-up. - Patient will notify us when ready for [place] to order Cologuard, she is self pay and cost up to $600

## 2016-04-09 NOTE — Progress Notes (Signed)
Subjective:    Patient ID: [name], female    DOB: 1966-05-02, 50 y.o.   MRN: [id number]  [name] is a 50 y.o. female presenting on 04/09/2016 for Annual Exam (does not need papsmear)   HPI   History of Elevated A1c, Abnormal Glucose, not Pre-Diabetes - Reports prior history of a "borderline" A1c value, last on our record is A1c 5.6 in 2014, she did not follow-up regularly since then. No prior treatment. Currently she is no longer dieting and eating right foods, and regular exercise. Does not eat fast food. Does eat red meat, carbs, spaghetti, bread - Daughter with Type 2 Diabetes, Mother borderline diabetes - No known family history of HTN, HLD, CAD  HYPERLIPIDEMIA: - Reports no concerns. Last lipid panel 2016, mild low HDL 32 and high TG >180. Previously was on Lipitor 20mg  daily, was tolerating well without myalgias, stopped taking this when fell out of follow-up. Now interested in lipids, is fasting today, then will consider resuming statin.  ADJUSTMENT Disorder with mixed symptoms / INSOMNIA: - Reports has had some bereavement and depressive symptoms over past 2 years, trigger of her mom passing (following complications from knee surgery, unexpectedly), she was her best friend, and she had a difficult time adjusting, biggest issue for her was decision to "remove life support". Symptoms have significantly improved, but still worse around the date of her passing 11/18. Previously did do therapy and counseling initially but did not continue. Never treated with anti-depressant medication and not interested in this. - Currently describes mild depressive symptoms episodic, not regularly. Also with associated anxiety. Symptoms do not prevent her from normal functioning with work or home life - Recent life stressors with building new house, family, holidays, and ultimately feels she will do better when in new home and not living together with large extended family - With insomnia,  usually problem staying falling asleep has mind racing and hard to "turn off" - Drinks some coffee daily about 3 cups daily, none after 6pm - Stays active with house cleaning etc, going to bed too early sometimes lays awake can't sleep - Drinks 1 glass of wine to help relax occasional, 1x weekly or every other - Denies any thoughts of harming self or others, manic symptoms, mood instability  TOBACCO ABUSE: - Recently over past 1 month, almost quit smoking from 2ppd, down to 1 cig daily, self weaning off with her husband after he had heart attack. Using vaporizer during the day several times a day, husband on 5 cigs daily. - Previously smoked for >30 years  Health Maintenance: - S/p hysterectomy, was advised at last pap smear 3 years ago that she did not require any further pap smears - Last mammogram, 2015, has a visit today with Dr Jamal Collin for General Surgery for breast exams routinely, she had a prior benign rapid growing breast mass. Family history of breast masses but no cancer. - Never had colonoscopy, considering cologuard. Not ready for colonscopy - Will get HIV screen today - Flu shot was done in [place] (03/2016) at [facility] - Will get TDap in future   Past Medical History:  Diagnosis Date  . Breast mass 2014  . High cholesterol    Social History   Social History  . Marital status: Married    Spouse name: N/A  . Number of children: N/A  . Years of education: N/A   Occupational History  . Construction    Social History Main Topics  . Smoking  status: Current Every Day Smoker    Packs/day: 1.00    Years: 30.00    Types: Cigarettes  . Smokeless tobacco: Never Used  . Alcohol use Yes  . Drug use: No  . Sexual activity: Not on file   Other Topics Concern  . Not on file   Social History Narrative  . No narrative on file   Family History  Problem Relation Age of Onset  . Diabetes Mother   . Diabetes Daughter   . Hearing loss Neg Hx   . Hypertension Neg Hx    . Hyperlipidemia Neg Hx    Current Outpatient Prescriptions on File Prior to Visit  Medication Sig  . aspirin 81 MG tablet Take 81 mg by mouth daily.  . cyclobenzaprine (FLEXERIL) 5 MG tablet Take 1 tablet (5 mg total) by mouth 3 (three) times daily as needed for muscle spasms.  . Fish Oil-Cholecalciferol (FISH OIL + D3 PO) Take 1 tablet by mouth daily.   No current facility-administered medications on file prior to visit.     GAD 7 : Generalized Anxiety Score 04/09/2016  Nervous, Anxious, on Edge 2  Control/stop worrying 0  Worry too much - different things 1  Trouble relaxing 0  Restless 1  Easily annoyed or irritable 3  Afraid - awful might happen 0  Total GAD 7 Score 7  Anxiety Difficulty Not difficult at all   Depression screen [facility] 2/9 04/09/2016  Decreased Interest 1  Down, Depressed, Hopeless 1  PHQ - 2 Score 2  Altered sleeping 1  Tired, decreased energy 1  Change in appetite 1  Feeling bad or failure about yourself  0  Trouble concentrating 1  Moving slowly or fidgety/restless 0  Suicidal thoughts 0  PHQ-9 Score 6     Review of Systems  Constitutional: Negative for activity change, appetite change, chills, diaphoresis, fatigue and fever.  HENT: Negative for congestion and hearing loss.   Eyes: Negative for visual disturbance.  Respiratory: Negative for cough, chest tightness, shortness of breath and wheezing.   Cardiovascular: Negative for chest pain, palpitations and leg swelling.  Gastrointestinal: Negative for abdominal pain, constipation, diarrhea, nausea and vomiting.  Endocrine: Negative for polydipsia and polyuria.  Genitourinary: Negative for dysuria, frequency and hematuria.  Musculoskeletal: Negative for arthralgias and neck pain.  Skin: Negative for rash.  Allergic/Immunologic: Negative for environmental allergies.  Neurological: Negative for dizziness, weakness, light-headedness, numbness and headaches.  Hematological: Negative for adenopathy.    Psychiatric/Behavioral: Positive for dysphoric mood and sleep disturbance. Negative for behavioral problems. The patient is nervous/anxious.    Per HPI unless specifically indicated above     Objective:    BP 112/70   Pulse 67   Temp 97.9 F (36.6 C) (Oral)   Resp 16   Ht 5\' 3"  (1.6 m)   Wt 166 lb 6.4 oz (75.5 kg)   BMI 29.48 kg/m   Wt Readings from Last 3 Encounters:  04/09/16 168 lb (76.2 kg)  04/09/16 166 lb 6.4 oz (75.5 kg)  02/06/16 160 lb 8 oz (72.8 kg)    Physical Exam  Constitutional: She is oriented to person, place, and time. She appears well-developed and well-nourished. No distress.  Well-appearing, comfortable, cooperative  HENT:  Head: Normocephalic and atraumatic.  Mouth/Throat: Oropharynx is clear and moist.  Eyes: Conjunctivae and EOM are normal. Pupils are equal, round, and reactive to light.  Neck: Normal range of motion. Neck supple. No thyromegaly present.  Cardiovascular: Normal rate, regular rhythm, normal  heart sounds and intact distal pulses.   No murmur heard. Pulmonary/Chest: Effort normal and breath sounds normal. No respiratory distress. She has no wheezes. She has no rales.  Abdominal: Soft. Bowel sounds are normal. She exhibits no distension and no mass. There is no tenderness.  Musculoskeletal: Normal range of motion. She exhibits no edema or tenderness.  Back normal without deformity or abnormal curvature.  Upper / Lower Extremities: - Normal muscle tone, strength bilateral upper extremities 5/5, lower extremities 5/5 - Bilateral shoulders, knees, wrist, ankles without deformity, tenderness, effusion - Normal Gait  Lymphadenopathy:    She has no cervical adenopathy.  Neurological: She is alert and oriented to person, place, and time.  Skin: Skin is warm and dry. No rash noted. She is not diaphoretic.  Psychiatric: She has a normal mood and affect. Her behavior is normal.  Well groomed, good eye contact, normal speech and thoughts   Nursing note and vitals reviewed.      Assessment & Plan:   Problem List Items Addressed This Visit    Nicotine dependence    Nearly quit smoking, down from 2ppd to 1 cig daily, also using vaporizer up to 3-5 times, helping her husband quit smoking, ever since he had heart attack recently. - Smoking cessation counseling, discussed still some risks with heated vapor to mucosal lining, but less risk, can taper down      Hyperlipidemia    Reviewed last lipid panel 2016, mild low HDL and inc TG >180, concern with reduced healthy lifestyle currently - Check fasting lipids, follow-up, ASCVD risk, determine if need to resume lipitor or statin therapy      Relevant Orders   COMPLETE METABOLIC PANEL WITH GFR   Lipid panel   Colon cancer screening    Due for routine colon cancer screening. Never had colonoscopy (not interested), no family history colon cancer. - Discussion today about recommendations for either Colonoscopy or Cologuard screening, benefits and risks of screening, interested in Cologuard, understands that if positive then recommendation is for diagnostic colonoscopy to follow-up. - Patient will notify us when ready for [place] to order Cologuard, she is self pay and cost up to $600      Adjustment disorder    Stable, to improving over time. Not consistent with clinical MDD at this time. Related to specific life stressor with trigger of her mother passing (removing her life support) worse around 11/18 date - No medications  Plan: 1. Discussion on her symptoms, and reassurance and encouragement that she seems to be doing well. No needs identified today. Not indicated for medication, patient not interested, and not interested in resuming therapy at this time. 2. Follow-up as needed       Other Visit Diagnoses    Annual physical exam    -  Primary   Mostly UTD, may get mammo, discussion on cologuard follow-up, fasting labs ordered to be drawn soon   Relevant Orders   COMPLETE  METABOLIC PANEL WITH GFR   Lipid panel   Abnormal glucose       Prior elevated glucose and borderline Pre-DM a1c 5.6, follow-up   Relevant Orders   Hemoglobin A1c   Screening for diabetes mellitus       Relevant Orders   Hemoglobin A1c   Screening for HIV (human immunodeficiency virus)       Relevant Orders   HIV antibody      No orders of the defined types were placed in this encounter.  Follow up plan: Return in about 1 year (around 04/09/2017) for Annual physical.  Nobie Putnam, DO [facility] 04/09/2016, 4:57 PM

## 2016-04-09 NOTE — Progress Notes (Signed)
Patient ID: [name], female   DOB: 08-Apr-1966, 50 y.o.   MRN: [id number]  Chief Complaint  Patient presents with  . Follow-up    HPI [name] is a 50 y.o. female.  Here today for follow up left breast mass. She was unable to come in 2016, her husband had a heart attack. She had 2 nodules closer to the areola left breast in 2015. She states she can not feel anything different in the breast. Last mammogram was 2016. She is here with her daughter, [name]. I have reviewed the history of present illness with the patient.  HPI  Past Medical History:  Diagnosis Date  . Breast mass 2014  . High cholesterol     Past Surgical History:  Procedure Laterality Date  . BREAST BIOPSY Right 01-14-12    Family History  Problem Relation Age of Onset  . Diabetes Mother   . Diabetes Daughter   . Hearing loss Neg Hx   . Hypertension Neg Hx   . Hyperlipidemia Neg Hx     Social History Social History  Substance Use Topics  . Smoking status: Current Every Day Smoker    Packs/day: 1.00    Years: 30.00    Types: Cigarettes  . Smokeless tobacco: Never Used  . Alcohol use Yes    No Known Allergies  Current Outpatient Prescriptions  Medication Sig Dispense Refill  . aspirin 81 MG tablet Take 81 mg by mouth daily.    . cyclobenzaprine (FLEXERIL) 5 MG tablet Take 1 tablet (5 mg total) by mouth 3 (three) times daily as needed for muscle spasms. 30 tablet 0  . Fish Oil-Cholecalciferol (FISH OIL + D3 PO) Take 1 tablet by mouth daily.     No current facility-administered medications for this visit.     Review of Systems Review of Systems  Constitutional: Negative.   Respiratory: Negative.   Cardiovascular: Negative.     Blood pressure 128/72, pulse 76, resp. rate 14, height 5\' 3"  (1.6 m), weight 168 lb (76.2 kg).  Physical Exam Physical Exam  Constitutional: She is oriented to person, place, and time. She appears well-developed and well-nourished.  HENT:  Mouth/Throat:  Oropharynx is clear and moist.  Eyes: Conjunctivae are normal. No scleral icterus.  Neck: Neck supple.  Cardiovascular: Normal rate, regular rhythm and normal heart sounds.   Pulmonary/Chest: Effort normal and breath sounds normal. Right breast exhibits no inverted nipple, no mass, no nipple discharge, no skin change and no tenderness. Left breast exhibits no inverted nipple, no mass, no nipple discharge, no skin change and no tenderness.  Abdominal: Soft. Bowel sounds are normal. There is no tenderness.  Lymphadenopathy:    She has no cervical adenopathy.    She has no axillary adenopathy.  Neurological: She is alert and oriented to person, place, and time.  Skin: Skin is warm and dry.  Psychiatric: Her behavior is normal.    Data Reviewed Prior notes and mammograms. Left breast US was repeated today- Cyst/mass at 10ocl 4cmgn is 3.75mm in size  And the cyst at 11ocl is 1.8mm. These are smaller than they were over 2 yrs go Assessment    Benign left breast masses-possibly cysts    Plan    Patient will be asked to return to the office in 2 months with a bilateral screening mammogram.       This information has been scribed by Isabella Fetch RN, BSN,BC.  SANKAR,SEEPLAPUTHUR G 04/13/2016, 10:49 AM

## 2016-04-09 NOTE — Assessment & Plan Note (Signed)
Nearly quit smoking, down from 2ppd to 1 cig daily, also using vaporizer up to 3-5 times, helping her husband quit smoking, ever since he had heart attack recently. - Smoking cessation counseling, discussed still some risks with heated vapor to mucosal lining, but less risk, can taper down

## 2016-04-09 NOTE — Assessment & Plan Note (Signed)
Stable, to improving over time. Not consistent with clinical MDD at this time. Related to specific life stressor with trigger of her mother passing (removing her life support) worse around 11/18 date - No medications  Plan: 1. Discussion on her symptoms, and reassurance and encouragement that she seems to be doing well. No needs identified today. Not indicated for medication, patient not interested, and not interested in resuming therapy at this time. 2. Follow-up as needed

## 2016-04-10 LAB — HEMOGLOBIN A1C
Hgb A1c MFr Bld: 5.3 % (ref <5.7)
MEAN PLASMA GLUCOSE: 105 mg/dL

## 2016-04-10 LAB — HIV ANTIBODY (ROUTINE TESTING W REFLEX): HIV 1&2 Ab, 4th Generation: NONREACTIVE

## 2016-04-13 ENCOUNTER — Encounter: Payer: Self-pay | Admitting: General Surgery

## 2016-04-13 ENCOUNTER — Other Ambulatory Visit: Payer: Self-pay

## 2016-04-13 DIAGNOSIS — Z1231 Encounter for screening mammogram for malignant neoplasm of breast: Secondary | ICD-10-CM

## 2016-06-09 ENCOUNTER — Ambulatory Visit: Payer: Self-pay

## 2016-06-18 ENCOUNTER — Ambulatory Visit: Payer: PRIVATE HEALTH INSURANCE | Admitting: General Surgery

## 2016-06-24 ENCOUNTER — Ambulatory Visit
Admission: RE | Admit: 2016-06-24 | Discharge: 2016-06-24 | Disposition: A | Discharge status: Home / Self Care | Payer: Self-pay | Source: Ambulatory Visit | Attending: General Surgery | Admitting: General Surgery

## 2016-06-24 DIAGNOSIS — Z1231 Encounter for screening mammogram for malignant neoplasm of breast: Secondary | ICD-10-CM | POA: Insufficient documentation

## 2016-07-21 ENCOUNTER — Ambulatory Visit: Payer: PRIVATE HEALTH INSURANCE | Admitting: General Surgery

## 2016-07-31 ENCOUNTER — Ambulatory Visit: Payer: Self-pay | Admitting: Physician Assistant

## 2016-08-06 ENCOUNTER — Ambulatory Visit: Payer: PRIVATE HEALTH INSURANCE | Admitting: General Surgery

## 2016-08-18 ENCOUNTER — Ambulatory Visit (INDEPENDENT_AMBULATORY_CARE_PROVIDER_SITE_OTHER): Payer: PRIVATE HEALTH INSURANCE | Admitting: General Surgery

## 2016-08-18 ENCOUNTER — Encounter: Payer: Self-pay | Admitting: General Surgery

## 2016-08-18 VITALS — BP 116/68 | HR 76 | Ht 66.0 in | Wt 173.0 lb

## 2016-08-18 DIAGNOSIS — N6322 Unspecified lump in the left breast, upper inner quadrant: Secondary | ICD-10-CM

## 2016-08-18 NOTE — Progress Notes (Signed)
Patient ID: [name], female   DOB: [birth year], 51 y.o.   MRN: [id number]  Chief Complaint  Patient presents with  . Follow-up    HPI [name] is a 50 y.o. female who presents for a breast evaluation. The most recent mammogram was done on 06/24/2016.  Patient does perform regular self breast checks and gets regular mammograms done.   I have reviewed the history of present illness with the patient.  HPI  Past Medical History:  Diagnosis Date  . High cholesterol     Past Surgical History:  Procedure Laterality Date  . BREAST BIOPSY Right 01-14-12   core    Family History  Problem Relation Age of Onset  . Diabetes Mother   . Diabetes Daughter   . Hearing loss Neg Hx   . Hypertension Neg Hx   . Hyperlipidemia Neg Hx   . Breast cancer Neg Hx     Social History Social History  Substance Use Topics  . Smoking status: Current Every Day Smoker    Packs/day: 1.00    Years: 30.00    Types: Cigarettes  . Smokeless tobacco: Never Used  . Alcohol use Yes    No Known Allergies  Current Outpatient Prescriptions  Medication Sig Dispense Refill  . aspirin 81 MG tablet Take 81 mg by mouth daily.    . cyclobenzaprine (FLEXERIL) 5 MG tablet Take 1 tablet (5 mg total) by mouth 3 (three) times daily as needed for muscle spasms. 30 tablet 0  . Fish Oil-Cholecalciferol (FISH OIL + D3 PO) Take 1 tablet by mouth daily.     No current facility-administered medications for this visit.     Review of Systems Review of Systems  Constitutional: Negative.   Respiratory: Negative.   Cardiovascular: Negative.     Blood pressure 116/68, pulse 76, height 5\' 6"  (1.676 m), weight 173 lb (78.5 kg).  Physical Exam Physical Exam  Constitutional: She is oriented to person, place, and time. She appears well-developed and well-nourished.  Eyes: Conjunctivae are normal. No scleral icterus.  Neck: Neck supple.  Cardiovascular: Normal rate, regular rhythm and normal heart sounds.    Pulmonary/Chest: Effort normal and breath sounds normal. Right breast exhibits no inverted nipple, no mass, no nipple discharge, no skin change and no tenderness. Left breast exhibits no inverted nipple, no mass, no nipple discharge, no skin change and no tenderness.  Lymphadenopathy:    She has no cervical adenopathy.    She has no axillary adenopathy.  Neurological: She is alert and oriented to person, place, and time.  Skin: Skin is warm and dry.    Data Reviewed Mammogram reviewed -no abnormality  Assessment    2 tiny less than 3 mm cysts on left breast. Exam is stable.  Plan    Patient will be asked to return to her PCP in with a bilateral screening mammogram yearly. Discussed colonoscopy with patient as she is now 50-she will call back to schedule. Reasons for colonoscopy and benefits explained     This information has been scribed by Gaspar Cola CMA.   Salina Stanfield G 08/18/2016, 11:08 AM

## 2016-08-18 NOTE — Patient Instructions (Signed)
Patient will be asked to return to her PCP in with a bilateral screening mammogram yearly.

## 2016-12-23 ENCOUNTER — Encounter: Payer: Self-pay | Admitting: Family Medicine

## 2016-12-23 ENCOUNTER — Other Ambulatory Visit: Payer: Self-pay | Admitting: Family Medicine

## 2016-12-23 ENCOUNTER — Ambulatory Visit (INDEPENDENT_AMBULATORY_CARE_PROVIDER_SITE_OTHER): Payer: Self-pay | Admitting: Family Medicine

## 2016-12-23 VITALS — BP 116/70 | HR 75 | Temp 98.5°F | Resp 16 | Ht 66.0 in | Wt 172.0 lb

## 2016-12-23 DIAGNOSIS — R630 Anorexia: Secondary | ICD-10-CM

## 2016-12-23 DIAGNOSIS — Z0000 Encounter for general adult medical examination without abnormal findings: Secondary | ICD-10-CM

## 2016-12-23 DIAGNOSIS — E782 Mixed hyperlipidemia: Secondary | ICD-10-CM

## 2016-12-23 DIAGNOSIS — S0990XA Unspecified injury of head, initial encounter: Secondary | ICD-10-CM

## 2016-12-23 DIAGNOSIS — R42 Dizziness and giddiness: Secondary | ICD-10-CM

## 2016-12-23 DIAGNOSIS — R110 Nausea: Secondary | ICD-10-CM
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
## 2016-12-23 NOTE — Patient Instructions (Addendum)
Thank you for coming to the clinic today.  1. Increase Flexeril from 5 to 10 (take 2 of the 5mg   Pills) - call office if helping and need new rx  2. Increase nutrition with added Boost or Ensure morning or for lunch supplement  3. Improve hydration with water intake to avoid low BP when sudden standing  4. Let me know if interested in a short course of Zofran for nausea, if not improved, otherwise may try OTC Dramamine or Meclizine for dizzy  DUE for FASTING BLOOD WORK (no food or drink after midnight before the lab appointment, only water or coffee without cream/sugar on the morning of)  SCHEDULE "Lab Only" visit in the morning at the clinic for lab draw in 3 MONTH  - Make sure Lab Only appointment is at about 1 week before your next appointment, so that results will be available  For Lab Results, once available within 2-3 days of blood draw, you can can log in to MyChart online to view your results and a brief explanation. Also, we can discuss results at next follow-up visit.  Please schedule a Follow-up Appointment to: Return in about 3 months (around 03/25/2017) for Annual Physical.  If you have any other questions or concerns, please feel free to call the clinic or send a message through [place]. You may also schedule an earlier appointment if necessary.  Additionally, you may be receiving a survey about your experience at our clinic within a few days to 1 week by e-mail or mail. We value your feedback.  Nobie Putnam, DO [place]

## 2016-12-28 ENCOUNTER — Telehealth: Payer: Self-pay | Admitting: Family Medicine

## 2016-12-28 DIAGNOSIS — S29012A Strain of muscle and tendon of back wall of thorax, initial encounter: Secondary | ICD-10-CM

## 2016-12-28 MED ORDER — CYCLOBENZAPRINE HCL 10 MG PO TABS: 10.0000 mg | ORAL_TABLET | Freq: Three times a day (TID) | ORAL | 1 refills | Status: DC | PRN

## 2016-12-28 NOTE — Telephone Encounter (Signed)
Pt. Called  States that the  10 mg was working better.  Pt call back # is  [phone]

## 2016-12-28 NOTE — Telephone Encounter (Signed)
Sent new rx Flexeril 10mg  TID PRN #30 +1 refill, can increase # of pills if using more regular.  Nobie Putnam, [facility] 12/28/2016, 12:30 PM

## 2017-02-10 ENCOUNTER — Ambulatory Visit (INDEPENDENT_AMBULATORY_CARE_PROVIDER_SITE_OTHER): Payer: Self-pay

## 2017-02-10 DIAGNOSIS — Z23 Encounter for immunization: Secondary | ICD-10-CM

## 2017-05-27 ENCOUNTER — Inpatient Hospital Stay: Payer: Self-pay

## 2017-05-27 ENCOUNTER — Encounter: Payer: Self-pay | Admitting: General Surgery

## 2017-05-27 ENCOUNTER — Ambulatory Visit (INDEPENDENT_AMBULATORY_CARE_PROVIDER_SITE_OTHER): Payer: Self-pay | Admitting: General Surgery

## 2017-05-27 VITALS — BP 106/66 | HR 76 | Resp 14 | Ht 66.0 in | Wt 172.0 lb

## 2017-05-27 DIAGNOSIS — D171 Benign lipomatous neoplasm of skin and subcutaneous tissue of trunk: Secondary | ICD-10-CM | POA: Insufficient documentation

## 2017-05-27 DIAGNOSIS — N6324 Unspecified lump in the left breast, lower inner quadrant: Secondary | ICD-10-CM

## 2017-05-27 NOTE — Patient Instructions (Signed)
The patient is aware to call back for any questions or concerns.  

## 2017-05-27 NOTE — Progress Notes (Signed)
Patient ID: [name], female   DOB: February 09, 1966, 52 y.o.   MRN: [id number]  Chief Complaint  Patient presents with  . Mass    HPI [name] is a 52 y.o. female here today for a evaluation of two left breast lumps. She noticed this area just before Christmas. She states one of the areas is tender to touch. She rolled over in bed and felt the tenderness. Last mammogram was 06/24/2016. Last visit there was a 2 tiny 3 mm left breast cyst noticed. Former patient of Dr Jamal Collin.  She is here with her daughter.,[name].  HPI  Past Medical History:  Diagnosis Date  . High cholesterol     Past Surgical History:  Procedure Laterality Date  . BREAST BIOPSY Right 01-14-12   core    Family History  Problem Relation Age of Onset  . Diabetes Mother   . Diabetes Daughter   . Hearing loss Neg Hx   . Hypertension Neg Hx   . Hyperlipidemia Neg Hx   . Breast cancer Neg Hx     Social History Social History   Tobacco Use  . Smoking status: Current Every Day Smoker    Packs/day: 1.00    Years: 30.00    Pack years: 30.00    Types: Cigarettes  . Smokeless tobacco: Never Used  Substance Use Topics  . Alcohol use: Yes  . Drug use: No    No Known Allergies  Current Outpatient Medications  Medication Sig Dispense Refill  . aspirin 81 MG tablet Take 81 mg by mouth daily.    . cyclobenzaprine (FLEXERIL) 10 MG tablet Take 1 tablet (10 mg total) by mouth 3 (three) times daily as needed for muscle spasms. 30 tablet 1  . Fish Oil-Cholecalciferol (FISH OIL + D3 PO) Take 1 tablet by mouth daily.    Marland Kitchen loratadine (CLARITIN) 10 MG tablet Take 10 mg by mouth daily.     No current facility-administered medications for this visit.     Review of Systems Review of Systems  Constitutional: Negative.   Respiratory: Negative.   Cardiovascular: Negative.     Blood pressure 106/66, pulse 76, resp. rate 14, height 5\' 6"  (1.676 m), weight 172 lb (78 kg), SpO2 98 %.  Physical Exam Physical  Exam  Constitutional: She is oriented to person, place, and time. She appears well-developed and well-nourished.  HENT:  Mouth/Throat: Oropharynx is clear and moist.  Eyes: Conjunctivae are normal. No scleral icterus.  Neck: Neck supple.  Cardiovascular: Normal rate, regular rhythm and normal heart sounds.  Pulmonary/Chest: Breath sounds normal. Right breast exhibits no inverted nipple, no mass, no nipple discharge, no skin change and no tenderness. Left breast exhibits mass. Left breast exhibits no inverted nipple, no nipple discharge, no skin change and no tenderness.    LIQ left breast mass  Lymphadenopathy:    She has no cervical adenopathy.    She has no axillary adenopathy.  Neurological: She is alert and oriented to person, place, and time.  Skin: Skin is warm and dry.  Skin cyst LIQ left breast  Psychiatric: Her behavior is normal.    Data Reviewed Ultrasound examination of the nodular areas in the area overlying the sternum in the lower inner the left breast yielded 2 hyperechoic nodules abutting the overlying dermis the presternal lesion measuring 0.84 x 1.35 x 1.43 cm.  The area within the substance of the breast measuring 0.6 x 1.35 x 1.68 cm.  The appearance is that of a  lipoma.  BI-RADS-1 Assessment    Lipomas of the left chest wall.    Plan    No intervention required.    Follow up as needed.    HPI, Physical Exam, Assessment and Plan have been scribed under the direction and in the presence of Robert Bellow, MD. Karie Fetch, RN  I have completed the exam and reviewed the above documentation for accuracy and completeness.  I agree with the above.  Haematologist has been used and any errors in dictation or transcription are unintentional.  Hervey Ard, M.D., F.A.C.S.  Robert Bellow 05/27/2017, 10:49 AM

## 2017-06-28 ENCOUNTER — Ambulatory Visit: Payer: Self-pay | Admitting: Family Medicine

## 2017-07-26 ENCOUNTER — Ambulatory Visit: Payer: Self-pay | Admitting: Family Medicine

## 2017-07-26 ENCOUNTER — Encounter: Payer: Self-pay | Admitting: Family Medicine

## 2017-07-26 VITALS — BP 106/64 | HR 66 | Temp 98.1°F | Resp 16 | Ht 66.0 in | Wt 173.0 lb

## 2017-07-26 DIAGNOSIS — M5136 Other intervertebral disc degeneration, lumbar region: Secondary | ICD-10-CM | POA: Insufficient documentation

## 2017-07-26 DIAGNOSIS — G8929 Other chronic pain: Secondary | ICD-10-CM | POA: Insufficient documentation

## 2017-07-26 DIAGNOSIS — E782 Mixed hyperlipidemia: Secondary | ICD-10-CM

## 2017-07-26 DIAGNOSIS — M5441 Lumbago with sciatica, right side: Secondary | ICD-10-CM | POA: Insufficient documentation

## 2017-07-26 DIAGNOSIS — R5383 Other fatigue: Secondary | ICD-10-CM

## 2017-07-26 DIAGNOSIS — Z833 Family history of diabetes mellitus: Secondary | ICD-10-CM

## 2017-07-26 DIAGNOSIS — Z23 Encounter for immunization: Secondary | ICD-10-CM

## 2017-07-26 MED ORDER — GABAPENTIN 100 MG PO CAPS: ORAL_CAPSULE | ORAL | 1 refills | Status: DC

## 2017-07-26 NOTE — Patient Instructions (Addendum)
Thank you for coming to the office today.  1. Start Gabapentin 100mg  capsules, take at night for 2-3 nights only, and then increase to 2 times a day for a few days, and then may increase to 3 times a day, it may make you drowsy, if helps significantly at night only, then you can increase instead to 3 capsules at night, instead of 3 times a day - In the future if needed, we can significantly increase the dose if tolerated well, some common doses are 300mg  three times a day up to 600mg  three times a day, usually it takes several weeks or months to get to higher doses  May continue Flexeril as prescribed  May use Ibuprofen as needed  May use Tylenol Extra Str 500mg  tabs - may take 1-2 tablets every 6 hours as needed  Recommend to start using heating pad on your lower back 1-2x daily for few weeks  This pain may take weeks to months to fully resolve, but hopefully it will respond to the medicine initially. All back injuries (small or serious) are slow to heal since we use our back muscles every day. Be careful with turning, twisting, lifting, sitting / standing for prolonged periods, and avoid re-injury.  If your symptoms significantly worsen with more pain, or new symptoms with weakness in one or both legs, new or different shooting leg pains, numbness in legs or groin, loss of control or retention of urine or bowel movements, please call back for advice and you may need to go directly to the Emergency Department.   Please schedule a Follow-up Appointment to: Return if symptoms worsen or fail to improve.    If you have any other questions or concerns, please feel free to call the office or send a message through [place]. You may also schedule an earlier appointment if necessary.  Additionally, you may be receiving a survey about your experience at our office within a few days to 1 week by e-mail or mail. We value your feedback.  Nobie Putnam, DO [place]

## 2017-07-26 NOTE — Progress Notes (Signed)
Subjective:    Patient ID: [name], female    DOB: [birth year], 52 y.o.   MRN: [id number]  [name] is a 52 y.o. 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 She does have chronic history since age 21s with back problem due to pregnancy and wt gain and working with dry wall and heavy lifting. She was told by chiro that R leg slightly shorter as well - Now followed by [employer] [facility] in [place], seems to be doing better - Still admits some pain radiating down back of R leg into buttocks and lower leg, not to foot - Still taking Flexeril 10mg  PRN, was inc from 5, tolerating well - Taking Ibuprofen PRN as well - Some difficulty with sleep at night due to back pain, using ice and heating PRN - Never on Gabapentin. Not on opiates or other medicines - Denies injury fall trauma weakness in leg or persistent numbness tingling  HYPERLIPIDEMIA: - Reports she is due for fasting labs. Last lipid panel 03/2016, elevated LDL, TG and low HDL. Not on statin therapy, she missed last apt for labs Lifestyle - Diet: balanced but admits needs to improve - Exercise: limited due to back recently  Fatigue Reports some mild fatigue seems to be intermittent more recently worse seems related to her back pain and problems, otherwise no other significant symptoms endorsed that are related. Requesting blood work  Fam history of DM Request A1c for DM screening. Last A1c 5.3 (03/2016)  Social history: Patient is still self pay, requesting some labs for yearly check-up.  Health Maintenance: Due for 2nd dose Shingrix (shingles vaccine) today. Received 1st dose Shingrix 02/10/17.  Depression screen  [facility] 2/9 07/26/2017 04/09/2016  Decreased Interest 0 1  Down, Depressed, Hopeless 1 1  PHQ - 2 Score 1 2  Altered sleeping 1 1  Tired, decreased energy 2 1  Change in appetite 0 1  Feeling bad or failure about yourself  0 0  Trouble concentrating 0 1  Moving slowly or fidgety/restless 0 0  Suicidal thoughts 0 0  PHQ-9 Score 4 6  Difficult doing work/chores Somewhat difficult -    Social History   Tobacco Use  . Smoking status: Current Every Day Smoker    Packs/day: 1.00    Years: 30.00    Pack years: 30.00    Types: Cigarettes  . Smokeless tobacco: Never Used  . Tobacco comment: pt is only smoking 2 cigarettes per day  Substance Use Topics  . Alcohol use: Yes  . Drug use: No    Review of Systems  Constitutional: Positive for fatigue. Negative for activity change, appetite change, chills, diaphoresis and fever.  HENT: Negative for congestion and hearing loss.   Eyes: Negative for visual disturbance.  Respiratory: Negative for apnea, cough, choking, chest tightness, shortness of breath and wheezing.   Cardiovascular: Negative for chest pain, palpitations and leg swelling.  Gastrointestinal: Negative for abdominal pain, anal bleeding, blood in stool, constipation, diarrhea, nausea and vomiting.  Endocrine: Negative for cold intolerance and polyuria.  Genitourinary: Negative for difficulty urinating, dysuria, frequency, hematuria, pelvic pain and urgency.  Musculoskeletal: Positive for back pain. Negative for arthralgias and neck pain.  Skin: Negative for rash.  Allergic/Immunologic: Negative for environmental allergies.  Neurological: Negative for dizziness, weakness, light-headedness, numbness and headaches.  Hematological: Negative for adenopathy.  Psychiatric/Behavioral: Negative for behavioral problems, dysphoric mood and sleep disturbance. The patient is not nervous/anxious.    Per HPI unless specifically indicated above     Objective:    BP 106/64   Pulse 66    Temp 98.1 F (36.7 C) (Oral)   Resp 16   Ht 5\' 6"  (1.676 m)   Wt 173 lb (78.5 kg)   BMI 27.92 kg/m   Wt Readings from Last 3 Encounters:  07/26/17 173 lb (78.5 kg)  05/27/17 172 lb (78 kg)  12/23/16 172 lb (78 kg)    Physical Exam  Constitutional: She is oriented to person, place, and time. She appears well-developed and well-nourished. No distress.  Well-appearing, comfortable, cooperative  HENT:  Head: Normocephalic and atraumatic.  Mouth/Throat: Oropharynx is clear and moist.  Eyes: Conjunctivae are normal. Right eye exhibits no discharge. Left eye exhibits no discharge.  Neck: Normal range of motion. Neck supple. No thyromegaly present.  Cardiovascular: Normal rate, regular rhythm, normal heart sounds and intact distal pulses.  No murmur heard. Pulmonary/Chest: Effort normal and breath sounds normal. No respiratory distress. She has no wheezes. She has no rales.  Musculoskeletal: Normal range of motion. She exhibits no edema.  Low Back Inspection: Normal appearance, no spinal deformity, symmetrical. Palpation: No tenderness over spinous processes. Bilateral lumbar paraspinal muscles non-tender but with mild hypertonicity/spasm. ROM: Full active ROM forward flex / back extension, rotation L/R without discomfort Special Testing: Seated SLR positive for R lower ext pain, did not complete exam due to discomfort Strength: Bilateral hip flex/ext 5/5, knee flex/ext 5/5, ankle dorsiflex/plantarflex 5/5 Neurovascular: intact distal sensation to light touch  Lymphadenopathy:    She has no cervical adenopathy.  Neurological: She is alert and oriented to person, place, and time.  Skin: Skin is warm and dry. No rash noted. She is not diaphoretic. No erythema.  Psychiatric: She has a normal mood and affect. Her behavior is normal.  Well groomed, good eye contact, normal speech and thoughts  Nursing note and vitals reviewed.  Results for orders placed or performed in visit on 04/09/16    COMPLETE METABOLIC PANEL WITH GFR  Result Value Ref Range   Sodium 141 135 - 146 mmol/L   Potassium 4.2 3.5 - 5.3 mmol/L   Chloride 105 98 - 110 mmol/L   CO2 26 20 - 31 mmol/L   Glucose, Bld 81 65 - 99 mg/dL   BUN 18 7 - 25 mg/dL   Creat 0.62 0.50 - 1.05 mg/dL   Total Bilirubin 0.5 0.2 - 1.2 mg/dL   Alkaline Phosphatase 66 33 - 130 U/L   AST 22 10 - 35 U/L   ALT 26 6 - 29 U/L   Total Protein 6.7 6.1 - 8.1 g/dL   Albumin 4.2 3.6 - 5.1 g/dL   Calcium 9.3 8.6 - 10.4 mg/dL   GFR, Est African American >89 >=60 mL/min   GFR, Est Non African American >89 >=60 mL/min  Lipid panel  Result Value Ref Range   Cholesterol 194 <200 mg/dL   Triglycerides 210 (H) <150 mg/dL   HDL 32 (L) >50 mg/dL   Total CHOL/HDL Ratio 6.1 (H) <5.0 Ratio   VLDL 42 (H) <30 mg/dL   LDL Cholesterol 120 (H) <100 mg/dL  Hemoglobin A1c  Result Value Ref Range   Hgb A1c MFr Bld 5.3 <5.7 %  Mean Plasma Glucose 105 mg/dL  HIV antibody  Result Value Ref Range   HIV 1&2 Ab, 4th Generation NONREACTIVE NONREACTIVE      Assessment & Plan:   Problem List Items Addressed This Visit    Chronic right-sided low back pain with right-sided sciatica - Primary    Chronic R LBP with associated R sciatica. Suspect likely due to muscle spasm/strain, without known injury or trauma but with chronic strain on back with lifting for work - In setting of known chronic LBP with DJD, L4-5 by report - Concern positive SLR R  Plan: 1. Discussion on treatment options - agree with current follow-up with Chiropractor for now - May continue Flexeril and Ibuprofen NSAID PRN for now - Start Gabapentin 100mg  daily then titrate up to 300mg  daily - either at night or 100mg  TID - reviewed benefits, risks, side effects - may help sleep with sedation, otherwise caution sedation May use Tylenol PRN for breakthrough Encouraged use of heating pad 1-2x daily for now then PRN Follow-up as needed - may consider prednisone burst taper if need,  consider refer to ortho or formal PT      Relevant Medications   gabapentin (NEURONTIN) 100 MG capsule   Other Relevant Orders   Comprehensive metabolic panel   DDD (degenerative disc disease), lumbar   Relevant Medications   gabapentin (NEURONTIN) 100 MG capsule   Other Relevant Orders   Comprehensive metabolic panel   Hyperlipidemia    Previous result 2017 with uncontrolled cholesterol, some poor lifestyle Calculated ASCVD 10 yr risk score 5.1% as smoker in past  Plan: Encourage improved lifestyle - low carb/cholesterol, reduce portion size, continue improving regular exercise Follow-up with fasting labs through [facility] [employer] [facility]      Relevant Orders   Lipid panel    Other Visit Diagnoses    Fatigue, unspecified type       Relevant Orders   CBC   Family history of diabetes mellitus       Relevant Orders   Hemoglobin A1c   Need for vaccination for zoster       Relevant Orders   Varicella-zoster vaccine IM (Completed)      Meds ordered this encounter  Medications  . gabapentin (NEURONTIN) 100 MG capsule    Sig: Start 1 capsule daily, increase by 1 cap every 2-3 days as tolerated up to 3 times a day, or may take 3 at once in evening.    Dispense:  90 capsule    Refill:  1      Follow up plan: Return if symptoms worsen or fail to improve.  Labs ordered for [facility], printed and given to patient today, she will get labs later this week.  Nobie Putnam, [facility] 07/26/2017, 1:28 PM

## 2017-07-26 NOTE — Assessment & Plan Note (Signed)
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 Suspect likely due to muscle spasm/strain, without known injury or trauma but with chronic strain on back with lifting for work - In setting of known chronic LBP with DJD, L4-5 by report - Concern positive SLR R  Plan: 1. Discussion on treatment options - agree with current follow-up with Chiropractor for now - May continue Flexeril and Ibuprofen NSAID PRN for now - Start Gabapentin 100mg  daily then titrate up to 300mg  daily - either at night or 100mg  TID - reviewed benefits, risks, side effects - may help sleep with sedation, otherwise caution sedation May use Tylenol PRN for breakthrough Encouraged use of heating pad 1-2x daily for now then PRN Follow-up as needed - may consider prednisone burst taper if need, consider refer to ortho or formal PT

## 2017-07-26 NOTE — Assessment & Plan Note (Signed)
Previous result 2017 with uncontrolled cholesterol, some poor lifestyle Calculated ASCVD 10 yr risk score 5.1% as smoker in past  Plan: Encourage improved lifestyle - low carb/cholesterol, reduce portion size, continue improving regular exercise Follow-up with fasting labs through [employer]

## 2017-07-31 ENCOUNTER — Encounter: Payer: Self-pay | Admitting: Family Medicine

## 2017-08-03 ENCOUNTER — Encounter: Payer: Self-pay | Admitting: Family Medicine

## 2017-08-03 ENCOUNTER — Other Ambulatory Visit: Payer: Self-pay | Admitting: Family Medicine

## 2017-08-03 DIAGNOSIS — R7309 Other abnormal glucose: Secondary | ICD-10-CM | POA: Insufficient documentation

## 2017-08-03 LAB — CBC
Hematocrit: 42 % (ref 34.0–46.6)
Hemoglobin: 14.4 g/dL (ref 11.1–15.9)
MCH: 32.3 pg (ref 26.6–33.0)
MCHC: 34.3 g/dL (ref 31.5–35.7)
MCV: 94 fL (ref 79–97)
PLATELETS: 199 10*3/uL (ref 150–379)
RBC: 4.46 x10E6/uL (ref 3.77–5.28)
RDW: 12.6 % (ref 12.3–15.4)
WBC: 8 10*3/uL (ref 3.4–10.8)

## 2017-08-03 LAB — LIPID PANEL
CHOL/HDL RATIO: 6.5 ratio — AB (ref 0.0–4.4)
Cholesterol, Total: 203 mg/dL — ABNORMAL HIGH (ref 100–199)
HDL: 31 mg/dL — AB (ref >39)
LDL Calculated: 128 mg/dL — ABNORMAL HIGH (ref 0–99)
TRIGLYCERIDES: 222 mg/dL — AB (ref 0–149)
VLDL Cholesterol Cal: 44 mg/dL — ABNORMAL HIGH (ref 5–40)

## 2017-08-03 LAB — COMPREHENSIVE METABOLIC PANEL
A/G RATIO: 1.8 (ref 1.2–2.2)
ALT: 46 IU/L — ABNORMAL HIGH (ref 0–32)
AST: 35 IU/L (ref 0–40)
Albumin: 4.5 g/dL (ref 3.5–5.5)
Alkaline Phosphatase: 90 IU/L (ref 39–117)
BILIRUBIN TOTAL: 0.3 mg/dL (ref 0.0–1.2)
BUN/Creatinine Ratio: 27 — ABNORMAL HIGH (ref 9–23)
BUN: 17 mg/dL (ref 6–24)
CALCIUM: 9.7 mg/dL (ref 8.7–10.2)
CHLORIDE: 103 mmol/L (ref 96–106)
CO2: 23 mmol/L (ref 20–29)
Creatinine, Ser: 0.63 mg/dL (ref 0.57–1.00)
GFR, EST AFRICAN AMERICAN: 120 mL/min/{1.73_m2} (ref >59)
GFR, EST NON AFRICAN AMERICAN: 104 mL/min/{1.73_m2} (ref >59)
GLOBULIN, TOTAL: 2.5 g/dL (ref 1.5–4.5)
Glucose: 94 mg/dL (ref 65–99)
POTASSIUM: 4.7 mmol/L (ref 3.5–5.2)
SODIUM: 142 mmol/L (ref 134–144)
Total Protein: 7 g/dL (ref 6.0–8.5)

## 2017-08-03 LAB — HEMOGLOBIN A1C
Est. average glucose Bld gHb Est-mCnc: 114 mg/dL
HEMOGLOBIN A1C: 5.6 % (ref 4.8–5.6)

## 2017-09-09 ENCOUNTER — Telehealth: Payer: Self-pay | Admitting: Family Medicine

## 2017-09-09 DIAGNOSIS — G8929 Other chronic pain: Secondary | ICD-10-CM

## 2017-09-09 DIAGNOSIS — M5441 Lumbago with sciatica, right side: Principal | ICD-10-CM

## 2017-09-09 DIAGNOSIS — M5136 Other intervertebral disc degeneration, lumbar region: Secondary | ICD-10-CM

## 2017-09-09 MED ORDER — MELOXICAM 15 MG PO TABS
15.0000 mg | ORAL_TABLET | Freq: Every day | ORAL | 2 refills | Status: DC
Start: 2017-09-09 — End: 2019-03-02

## 2017-09-09 NOTE — Telephone Encounter (Signed)
Called patient back. She has nausea on gabapentin, does not want to take anymore. She is taking ibuprofen instead, she is concerned about potential side effects from NSAID. She is still taking Flexeril.  I advised we can switch ibuprofen to rx Meloxicam 15mg  daily to lessen some of the burden from NSAID potential side effects. She should inc dose of Tylenol PRN breakthrough as well.  Stop taking ibuprofen, advil aleve or other NSAID  Nobie Putnam, DO [facility] 09/09/2017, 12:34 PM

## 2017-09-09 NOTE — Addendum Note (Signed)
Addended by: Olin Hauser on: 09/09/2017 12:35 PM   Modules accepted: Orders

## 2017-09-09 NOTE — Telephone Encounter (Signed)
Pt. Called requesting that you call her about her gabapentin medication she states that it was making her sick  But needed something to replace gabapentin. Pt cal back # is  [phone]

## 2017-09-09 NOTE — Telephone Encounter (Signed)
There is not a good alternative to Gabapentin.  She has been on muscle relaxant before, such as Flexeril. We can renew Flexeril or switch to an alternative such as Baclofen or Tizanidine if she prefers.  Nobie Putnam, [facility] 09/09/2017, 12:10 PM

## 2017-10-13 ENCOUNTER — Ambulatory Visit: Payer: Self-pay | Admitting: Family Medicine

## 2017-10-15 ENCOUNTER — Ambulatory Visit: Payer: Self-pay | Admitting: Family Medicine

## 2017-10-19 ENCOUNTER — Ambulatory Visit: Payer: Self-pay | Admitting: Family Medicine

## 2017-10-19 ENCOUNTER — Encounter: Payer: Self-pay | Admitting: Family Medicine

## 2017-10-19 VITALS — BP 99/68 | HR 65 | Temp 98.0°F | Resp 16 | Ht 66.0 in | Wt 173.0 lb

## 2017-10-19 DIAGNOSIS — M5441 Lumbago with sciatica, right side: Secondary | ICD-10-CM

## 2017-10-19 DIAGNOSIS — M5136 Other intervertebral disc degeneration, lumbar region: Secondary | ICD-10-CM

## 2017-10-19 DIAGNOSIS — G8929 Other chronic pain: Secondary | ICD-10-CM

## 2017-10-19 MED ORDER — PREDNISONE 20 MG PO TABS: ORAL_TABLET | ORAL | 0 refills | Status: DC

## 2017-10-19 NOTE — Progress Notes (Signed)
Subjective:    Patient ID: [name], female    DOB: 1965-07-20, 52 y.o.   MRN: [id number]  [name] is a 52 y.o. female presenting on 10/19/2017 for Sciatica (chiropractor not improving Sxs)   HPI    Follow-up Back Pain / DJD L4-5 with Sciatica R>L Last visit 07/26/17 for same issue, initial background history, prior onset about 3-4 months ago Feb to March 2019 approx. She did not take Gabapentin, this was stopped. - Followed by [name] in [place], has completed 12 out of 16 visits, some improve but not resolved - Taking Meloxicam still, some relief but not resolving the issue, cannot take with Tylenol. Had problem with constipation when mixed with Tylenol. Takes some Flexeril as needed still - Today reports returned here for follow-up since back is not resolving, seems somewhat improved but still limited and no longer improving. Still has sciatica radiating pain. She remains physically active and unsure if she re-injured it - She does have chronic history since age 38s with back problem due to pregnancy and wt gain and working with dry wall and heavy lifting. She was told by chiro that R leg slightly shorter as well - Still admits some pain radiating down back of R leg into buttocks and lower leg, not to foot She has had X-rays done Lumbar Spine at chiropractor, office she reports that no known arthritis or wear and tear of spine. - Some difficulty with sleep at night due to back pain, using ice and heating PRN - Denies injury fall trauma weakness in leg or persistent numbness tingling  Depression screen [facility] 2/9 10/19/2017 07/26/2017 04/09/2016  Decreased Interest 0 0 1  Down, Depressed, Hopeless 0 1 1  PHQ - 2 Score 0 1 2  Altered sleeping - 1 1  Tired, decreased energy - 2 1  Change in appetite - 0 1  Feeling bad or failure about yourself  - 0 0  Trouble concentrating - 0 1  Moving slowly or fidgety/restless - 0 0  Suicidal thoughts - 0 0  PHQ-9 Score - 4 6    Difficult doing work/chores - Somewhat difficult -    Social History   Tobacco Use  . Smoking status: Current Every Day Smoker    Packs/day: 1.00    Years: 30.00    Pack years: 30.00    Types: Cigarettes  . Smokeless tobacco: Never Used  . Tobacco comment: pt is only smoking 2 cigarettes per day  Substance Use Topics  . Alcohol use: Yes  . Drug use: No    Review of Systems Per HPI unless specifically indicated above     Objective:    BP 99/68   Pulse 65   Temp 98 F (36.7 C) (Oral)   Resp 16   Ht 5\' 6"  (1.676 m)   Wt 173 lb (78.5 kg)   BMI 27.92 kg/m   Wt Readings from Last 3 Encounters:  10/19/17 173 lb (78.5 kg)  07/26/17 173 lb (78.5 kg)  05/27/17 172 lb (78 kg)    Physical Exam  Constitutional: She is oriented to person, place, and time. She appears well-developed and well-nourished. No distress.  Well-appearing, comfortable, cooperative  HENT:  Head: Normocephalic and atraumatic.  Mouth/Throat: Oropharynx is clear and moist.  Eyes: Conjunctivae are normal. Right eye exhibits no discharge. Left eye exhibits no discharge.  Neck: Normal range of motion. Neck supple. No thyromegaly present.  Cardiovascular: Normal rate, regular rhythm, normal heart sounds and  intact distal pulses.  No murmur heard. Pulmonary/Chest: Effort normal and breath sounds normal. No respiratory distress. She has no wheezes. She has no rales.  Musculoskeletal: Normal range of motion. She exhibits no edema.  Low Back - similar to last visit Inspection: Normal appearance, no spinal deformity, symmetrical. Palpation: No tenderness over spinous processes. Bilateral lumbar paraspinal muscles non-tender but with mild hypertonicity/spasm. ROM: Full active ROM forward flex / back extension, rotation L/R without discomfort Special Testing: Seated SLR positive for R lower ext pain Strength: Bilateral hip flex/ext 5/5, knee flex/ext 5/5, ankle dorsiflex/plantarflex 5/5 Neurovascular: intact  distal sensation to light touch  Lymphadenopathy:    She has no cervical adenopathy.  Neurological: She is alert and oriented to person, place, and time.  Skin: Skin is warm and dry. No rash noted. She is not diaphoretic. No erythema.  Psychiatric: She has a normal mood and affect. Her behavior is normal.  Well groomed, good eye contact, normal speech and thoughts  Nursing note and vitals reviewed.  Results for orders placed or performed in visit on 07/26/17  Lipid panel  Result Value Ref Range   Cholesterol, Total 203 (H) 100 - 199 mg/dL   Triglycerides 222 (H) 0 - 149 mg/dL   HDL 31 (L) >39 mg/dL   VLDL Cholesterol Cal 44 (H) 5 - 40 mg/dL   LDL Calculated 128 (H) 0 - 99 mg/dL   Chol/HDL Ratio 6.5 (H) 0.0 - 4.4 ratio  Comprehensive metabolic panel  Result Value Ref Range   Glucose 94 65 - 99 mg/dL   BUN 17 6 - 24 mg/dL   Creatinine, Ser 0.63 0.57 - 1.00 mg/dL   GFR calc non Af Amer 104 >59 mL/min/1.73   GFR calc Af Amer 120 >59 mL/min/1.73   BUN/Creatinine Ratio 27 (H) 9 - 23   Sodium 142 134 - 144 mmol/L   Potassium 4.7 3.5 - 5.2 mmol/L   Chloride 103 96 - 106 mmol/L   CO2 23 20 - 29 mmol/L   Calcium 9.7 8.7 - 10.2 mg/dL   Total Protein 7.0 6.0 - 8.5 g/dL   Albumin 4.5 3.5 - 5.5 g/dL   Globulin, Total 2.5 1.5 - 4.5 g/dL   Albumin/Globulin Ratio 1.8 1.2 - 2.2   Bilirubin Total 0.3 0.0 - 1.2 mg/dL   Alkaline Phosphatase 90 39 - 117 IU/L   AST 35 0 - 40 IU/L   ALT 46 (H) 0 - 32 IU/L  CBC  Result Value Ref Range   WBC 8.0 3.4 - 10.8 x10E3/uL   RBC 4.46 3.77 - 5.28 x10E6/uL   Hemoglobin 14.4 11.1 - 15.9 g/dL   Hematocrit 42.0 34.0 - 46.6 %   MCV 94 79 - 97 fL   MCH 32.3 26.6 - 33.0 pg   MCHC 34.3 31.5 - 35.7 g/dL   RDW 12.6 12.3 - 15.4 %   Platelets 199 150 - 379 x10E3/uL  Hemoglobin A1c  Result Value Ref Range   Hgb A1c MFr Bld 5.6 4.8 - 5.6 %   Est. 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 - In setting of known chronic LBP with DJD, L4-5 by report  Plan: 1.  - Referral to [facility] for 2nd opinion and further management - may need repeat X-ray lumbar spine and possibly advanced imaging in future MRI if unresolved, consider formal PT and future other options - for now in interim start prednisone dose taper over 7 days, hold NSAID (meloxicam) after finish then she can restart meloxicam after - Continue Flexeril PRN - May hold tylenol for now Encouraged use of heating pad 1-2x daily for now then PRN Follow-up as needed  Orders Placed This Encounter  Procedures  . Ambulatory referral to Orthopedic Surgery    Referral Priority:   Routine    Referral Type:   Surgical    Referral Reason:   Specialty Services Required    Requested Specialty:   Orthopedic Surgery    Number of Visits Requested:   1     Meds ordered this encounter  Medications  . predniSONE (DELTASONE) 20 MG tablet    Sig: Take daily with food. Start with 60mg  (3 pills) x 2 days, then reduce to 40mg  (2 pills) x 2 days, then 20mg  (1 pill) x 3 days    Dispense:  13 tablet    Refill:  0    Follow up plan: Return if symptoms worsen or fail to improve, for back pain.  Nobie Putnam, [facility] 10/19/2017, 10:55 PM

## 2017-10-19 NOTE — Patient Instructions (Addendum)
Thank you for coming to the office today.  For back pain since not improved, concern sciatica with nerve - start Prednisone taper over next 7 days for pain and anti inflammatory  While on prednisone - STOP Meloxicam (do not take Ibuprofen, aleve)  May continue Flexeril as needed  Once completed Prednisone may resume Meloxicam until they can see you at Ortho  If not heard by end of week, then call ortho to check status  [facility] Address: [address], [place], [place] [address] Hours:  9AM-5PM Phone: [phone]   Please schedule a Follow-up Appointment to: Return if symptoms worsen or fail to improve, for back pain.  If you have any other questions or concerns, please feel free to call the office or send a message through [facility]. You may also schedule an earlier appointment if necessary.  Additionally, you may be receiving a survey about your experience at our office within a few days to 1 week by e-mail or mail. We value your feedback.  Nobie Putnam, DO [facility]

## 2017-11-02 ENCOUNTER — Other Ambulatory Visit: Payer: Self-pay | Admitting: Orthopedic Surgery

## 2017-11-02 DIAGNOSIS — M545 Low back pain: Secondary | ICD-10-CM

## 2017-11-12 ENCOUNTER — Ambulatory Visit
Admission: RE | Admit: 2017-11-12 | Discharge: 2017-11-12 | Disposition: A | Discharge status: Home / Self Care | Payer: Self-pay | Source: Ambulatory Visit | Attending: Orthopedic Surgery | Admitting: Orthopedic Surgery

## 2017-11-12 DIAGNOSIS — M1288 Other specific arthropathies, not elsewhere classified, other specified site: Secondary | ICD-10-CM | POA: Insufficient documentation

## 2017-11-12 DIAGNOSIS — M545 Low back pain: Secondary | ICD-10-CM | POA: Insufficient documentation

## 2017-11-12 DIAGNOSIS — M5127 Other intervertebral disc displacement, lumbosacral region: Secondary | ICD-10-CM | POA: Insufficient documentation

## 2017-11-12 DIAGNOSIS — M48061 Spinal stenosis, lumbar region without neurogenic claudication: Secondary | ICD-10-CM | POA: Insufficient documentation

## 2017-11-12 DIAGNOSIS — M5126 Other intervertebral disc displacement, lumbar region: Secondary | ICD-10-CM | POA: Insufficient documentation

## 2018-04-01 ENCOUNTER — Other Ambulatory Visit: Payer: Self-pay

## 2018-04-01 DIAGNOSIS — Z1231 Encounter for screening mammogram for malignant neoplasm of breast: Secondary | ICD-10-CM

## 2018-04-26 ENCOUNTER — Ambulatory Visit: Payer: Self-pay | Admitting: General Surgery

## 2018-04-28 ENCOUNTER — Ambulatory Visit
Admission: RE | Admit: 2018-04-28 | Discharge: 2018-04-28 | Disposition: A | Discharge status: Home / Self Care | Payer: Self-pay | Source: Ambulatory Visit | Attending: General Surgery | Admitting: General Surgery

## 2018-04-28 DIAGNOSIS — Z1231 Encounter for screening mammogram for malignant neoplasm of breast: Secondary | ICD-10-CM | POA: Insufficient documentation

## 2018-05-03 ENCOUNTER — Ambulatory Visit: Payer: Self-pay | Admitting: General Surgery

## 2018-05-04 ENCOUNTER — Encounter: Payer: Self-pay | Admitting: Family Medicine

## 2018-05-04 ENCOUNTER — Ambulatory Visit: Payer: Self-pay | Admitting: Family Medicine

## 2018-05-04 VITALS — BP 113/64 | HR 65 | Temp 98.1°F | Resp 16 | Ht 66.0 in | Wt 168.0 lb

## 2018-05-04 DIAGNOSIS — J0100 Acute maxillary sinusitis, unspecified: Secondary | ICD-10-CM

## 2018-05-04 MED ORDER — BENZONATATE 100 MG PO CAPS: 100.0000 mg | ORAL_CAPSULE | Freq: Three times a day (TID) | ORAL | 0 refills | Status: DC | PRN

## 2018-05-04 MED ORDER — AZITHROMYCIN 250 MG PO TABS: ORAL_TABLET | ORAL | 0 refills | Status: DC

## 2018-05-04 NOTE — Patient Instructions (Addendum)
Thank you for coming to the office today.  1. It sounds like you have a Sinusitis (Bacterial Infection) - this most likely started as an Upper Respiratory Virus that has settled into an infection. Allergies can also cause this.  Likely some tight breathing and cough from smoking as well  Start Azithromycin Z pak (antibiotic) 2 tabs day 1, then 1 tab x 4 days, complete entire course even if improved  Start Tessalon Perls take 1 capsule up to 3 times a day as needed for cough  Continue Flonase  Continue Mucinex for now then stop after 3-5 more days  Continue other cold medicine as needed  - Can take Tylenol or Ibuprofen as needed for fevers  If you develop persistent fever >101F for at least 3 consecutive days, headaches with sinus pain or pressure or persistent earache, please schedule a follow-up evaluation within next few days to week.  If short of breath, wheezing, persistent coughing or other respiratory concerns - call back or return we can send in Prednisone and Albuterol rescue inhaler   Please schedule a Follow-up Appointment to: Return in about 2 weeks (around 05/18/2018), or if symptoms worsen or fail to improve, for sinusitis / cough.  If you have any other questions or concerns, please feel free to call the office or send a message through [facility]. You may also schedule an earlier appointment if necessary.  Additionally, you may be receiving a survey about your experience at our office within a few days to 1 week by e-mail or mail. We value your feedback.  Nobie Putnam, DO [facility]

## 2018-05-04 NOTE — Progress Notes (Signed)
Subjective:    Patient ID: [name], female    DOB: May 20, 1966, 52 y.o.   MRN: [id number]  [name] is a 52 y.o. female presenting on 05/04/2018 for Cough (clear mucus, sinusitis onset 3 weeks)   HPI  COUGH / SINUSITIS Reports symptoms started soon after received Flu Vaccine this season about 3 weeks ago. She describes sinus drainage and cough. Now worsening cough at night with some clear congestion productive cough. - Recently she did a video call online doctor - started up Flonase recently for possible allergies - Tried Mucinex, Cold & Flu, Cough Drops - No known sick contacts - Active smoker, no prior history of COPD or Asthma. Never used Albuterol. - Admits deeper facial sinus pain and pressure - Admits previous sore - now improved - Denies any fevers, chills, nausea vomiting, body aches, ear pain  Health Maintenance: UTD Flu Vaccine this season  Depression screen [facility] 2/9 10/19/2017 07/26/2017 04/09/2016  Decreased Interest 0 0 1  Down, Depressed, Hopeless 0 1 1  PHQ - 2 Score 0 1 2  Altered sleeping - 1 1  Tired, decreased energy - 2 1  Change in appetite - 0 1  Feeling bad or failure about yourself  - 0 0  Trouble concentrating - 0 1  Moving slowly or fidgety/restless - 0 0  Suicidal thoughts - 0 0  PHQ-9 Score - 4 6  Difficult doing work/chores - Somewhat difficult -    Social History   Tobacco Use  . Smoking status: Current Some Day Smoker    Packs/day: 1.00    Years: 30.00    Pack years: 30.00    Types: Cigarettes, E-cigarettes  . Smokeless tobacco: Current User  . Tobacco comment: pt is only smoking 2 cigarettes per day  Substance Use Topics  . Alcohol use: Yes  . Drug use: No    Review of Systems Per HPI unless specifically indicated above     Objective:    BP 113/64   Pulse 65   Temp 98.1 F (36.7 C) (Oral)   Resp 16   Ht 5\' 6"  (1.676 m)   Wt 168 lb (76.2 kg)   SpO2 100%   BMI 27.12 kg/m   Wt Readings from Last 3 Encounters:    05/04/18 168 lb (76.2 kg)  10/19/17 173 lb (78.5 kg)  07/26/17 173 lb (78.5 kg)    Physical Exam  Constitutional: She is oriented to person, place, and time. She appears well-developed and well-nourished. No distress.  Well-appearing, comfortable, cooperative  HENT:  Head: Normocephalic and atraumatic.  Mouth/Throat: Oropharynx is clear and moist.  Mild L>R maxillary sinuses tender. Nares patent without purulence or edema. Bilateral TMs clear without erythema, effusion or bulging. Oropharynx clear without erythema, exudates, edema or asymmetry.  Eyes: Conjunctivae are normal. Right eye exhibits no discharge. Left eye exhibits no discharge.  Neck: Normal range of motion. Neck supple.  Cardiovascular: Normal rate, regular rhythm, normal heart sounds and intact distal pulses.  No murmur heard. Pulmonary/Chest: Effort normal and breath sounds normal. No respiratory distress. She has no wheezes. She has no rales.  Good air movement. Limited coughing. No focal abnormality  Musculoskeletal: Normal range of motion. She exhibits no edema.  Lymphadenopathy:    She has no cervical adenopathy.  Neurological: She is alert and oriented to person, place, and time.  Skin: Skin is warm and dry. No rash noted. She is not diaphoretic. No erythema.  Psychiatric: She has a normal  mood and affect. Her behavior is normal.  Well groomed, good eye contact, normal speech and thoughts  Nursing note and vitals reviewed.      Assessment & Plan:   Problem List Items Addressed This Visit    None    Visit Diagnoses    Acute non-recurrent maxillary sinusitis    -  Primary   Relevant Medications   azithromycin (ZITHROMAX Z-PAK) 250 MG tablet   benzonatate (TESSALON) 100 MG capsule     Consistent with acute vs subacute maxillary sinusitis, likely initially viral URI vs allergic rhinitis component with now with persistent duration >3 weeks and worsening concern for bacterial infection.  - Possible mild COPD  in patient active smoker, no prior confirmation or imaging or PFT to support  Plan: 1. Start Azithromycin Z pak (antibiotic) 2 tabs day 1, then 1 tab x 4 days, complete entire course even if improved - Start Tessalon Perls take 1 capsule up to 3 times a day as needed for cough 2. Continue Flonase as prescribed already - for several weeks - Continue mucinex 3-5 more days and OTC meds Return criteria reviewed -  May consider CXR, Prednisone, Albuterol if not improved    Meds ordered this encounter  Medications  . azithromycin (ZITHROMAX Z-PAK) 250 MG tablet    Sig: Take 2 tabs (500mg  total) on Day 1. Take 1 tab (250mg ) daily for next 4 days.    Dispense:  6 tablet    Refill:  0  . benzonatate (TESSALON) 100 MG capsule    Sig: Take 1 capsule (100 mg total) by mouth 3 (three) times daily as needed for cough.    Dispense:  30 capsule    Refill:  0    Follow up plan: Return in about 2 weeks (around 05/18/2018), or if symptoms worsen or fail to improve, for sinusitis / cough.   Nobie Putnam, [facility] 05/04/2018, 10:00 AM

## 2018-05-31 ENCOUNTER — Ambulatory Visit: Payer: Self-pay | Admitting: General Surgery

## 2018-06-07 ENCOUNTER — Telehealth: Payer: Self-pay | Admitting: General Surgery

## 2018-06-07 NOTE — Telephone Encounter (Signed)
Spoke with the patient regarding her appointment with dr byrnett on 06/28/18 patient stated she was doing well and would just wait for dr byrnett to come back to follow up.

## 2018-06-22 NOTE — Telephone Encounter (Signed)
Patients coming in on 06/28/2018 to see dr. Bary Castilla

## 2018-06-28 ENCOUNTER — Ambulatory Visit (INDEPENDENT_AMBULATORY_CARE_PROVIDER_SITE_OTHER): Payer: Self-pay | Admitting: General Surgery

## 2018-06-28 ENCOUNTER — Ambulatory Visit: Payer: Self-pay | Admitting: General Surgery

## 2018-06-28 ENCOUNTER — Encounter: Payer: Self-pay | Admitting: General Surgery

## 2018-06-28 ENCOUNTER — Other Ambulatory Visit: Payer: Self-pay

## 2018-06-28 VITALS — BP 103/68 | HR 62 | Temp 94.5°F | Resp 16 | Ht 66.0 in | Wt 170.6 lb

## 2018-06-28 DIAGNOSIS — Z1231 Encounter for screening mammogram for malignant neoplasm of breast: Secondary | ICD-10-CM

## 2018-06-28 NOTE — Patient Instructions (Signed)
The patient is aware to call back for any questions or new concerns. Patient will be asked to return to the office in one year with a bilateral screening mammogram.  

## 2018-06-28 NOTE — Progress Notes (Signed)
Patient ID: [name], female   DOB: 1965-08-23, 53 y.o.   MRN: [id number]  Chief Complaint  Patient presents with  . Follow-up     1 yr f/u rec Bil Screen [facility]-M[facility]MO COMPLETE    HPI [name] is a 53 y.o. female here today to follow up for 1 year follow up bilateral screening mammogram on 04-28-18. Patient states she is feeling well.  The previously identified breast lumps near the inframammary fold on the medial aspect of the left breast have been less symptomatic since her last exam.  HPI  Past Medical History:  Diagnosis Date  . High cholesterol     Past Surgical History:  Procedure Laterality Date  . BREAST BIOPSY Right 01-14-12   core    Family History  Problem Relation Age of Onset  . Diabetes Mother   . Diabetes Daughter   . Breast cancer Sister 54  . Hearing loss Neg Hx   . Hypertension Neg Hx   . Hyperlipidemia Neg Hx     Social History Social History   Tobacco Use  . Smoking status: Current Some Day Smoker    Packs/day: 1.00    Years: 30.00    Pack years: 30.00    Types: Cigarettes, E-cigarettes  . Smokeless tobacco: Current User  . Tobacco comment: pt is only smoking 2 cigarettes per day  Substance Use Topics  . Alcohol use: Yes  . Drug use: No    No Known Allergies  Current Outpatient Medications  Medication Sig Dispense Refill  . aspirin 81 MG tablet Take 81 mg by mouth daily.    . Fish Oil-Cholecalciferol (FISH OIL + D3 PO) Take 1 tablet by mouth daily.    Marland Kitchen loratadine (CLARITIN) 10 MG tablet Take 10 mg by mouth daily.    . meloxicam (MOBIC) 15 MG tablet Take 1 tablet (15 mg total) by mouth daily. After 4-6 weeks, may use as needed only 30 tablet 2   No current facility-administered medications for this visit.     Review of Systems Review of Systems  Constitutional: Negative.   Respiratory: Negative.   Cardiovascular: Negative.     Blood pressure 103/68, pulse 62, temperature (!) 94.5 F (34.7 C), temperature source Temporal,  resp. rate 16, height 5\' 6"  (1.676 m), weight 170 lb 9.6 oz (77.4 kg), SpO2 97 %.  Physical Exam Physical Exam Exam conducted with a chaperone present.  Constitutional:      Appearance: She is well-developed.  Eyes:     General: No scleral icterus.    Conjunctiva/sclera: Conjunctivae normal.  Neck:     Musculoskeletal: Normal range of motion and neck supple.  Cardiovascular:     Rate and Rhythm: Normal rate and regular rhythm.     Heart sounds: Normal heart sounds.  Pulmonary:     Effort: Pulmonary effort is normal.     Breath sounds: Normal breath sounds.  Chest:     Breasts:        Right: No inverted nipple, mass, nipple discharge, skin change or tenderness.        Left: No inverted nipple, mass, nipple discharge, skin change or tenderness.  Lymphadenopathy:     Cervical: No cervical adenopathy.  Skin:    General: Skin is warm and dry.  Neurological:     Mental Status: She is alert and oriented to person, place, and time.  Psychiatric:        Behavior: Behavior normal.     Data Reviewed Bilateral  screening mammograms dated April 28, 2018 were reported as BI-RADS 1.  They were present in the PACS system for review.  Assessment    Benign breast exam.    Plan  The patient has been asked to return in 1 year with a screening mammogram.  HPI, Physical Exam, Assessment and Plan have been scribed under the direction and in the presence of Hervey Ard, Md.  Eudelia Bunch R. Bobette Mo, CMA  HPI and physical exam has been scribed under the direction and in the presence of Robert Bellow, MD. Karie Fetch, RN  I have completed the exam and reviewed the above documentation for accuracy and completeness.  I agree with the above.  Haematologist has been used and any errors in dictation or transcription are unintentional.  Hervey Ard, M.D., F.A.C.S.  Forest Gleason Byrnett 06/29/2018, 3:43 PM

## 2018-07-06 ENCOUNTER — Telehealth: Payer: Self-pay | Admitting: *Deleted

## 2018-07-06 NOTE — Telephone Encounter (Signed)
-----   Message from Robert Bellow, MD sent at 07/05/2018 12:52 PM EST ----- Thanks for contacting [facility].  Please let the patient know I reviewed her most recent mammograms. OK. F/U next year as planned.

## 2018-07-06 NOTE — Telephone Encounter (Signed)
Notified patient as instructed, patient pleased. Discussed follow-up appointments, patient agrees  

## 2018-12-16 ENCOUNTER — Encounter: Payer: Self-pay | Admitting: General Surgery

## 2019-03-02 ENCOUNTER — Other Ambulatory Visit: Payer: Self-pay

## 2019-03-02 ENCOUNTER — Encounter: Payer: Self-pay | Admitting: General Surgery

## 2019-03-02 ENCOUNTER — Ambulatory Visit (INDEPENDENT_AMBULATORY_CARE_PROVIDER_SITE_OTHER): Payer: 59 | Admitting: General Surgery

## 2019-03-02 VITALS — BP 106/72 | HR 75 | Temp 95.7°F | Resp 12 | Ht 65.0 in | Wt 159.0 lb

## 2019-03-02 DIAGNOSIS — N63 Unspecified lump in unspecified breast: Secondary | ICD-10-CM | POA: Diagnosis not present

## 2019-03-02 DIAGNOSIS — N6002 Solitary cyst of left breast: Secondary | ICD-10-CM | POA: Diagnosis not present

## 2019-03-02 DIAGNOSIS — N6001 Solitary cyst of right breast: Secondary | ICD-10-CM | POA: Diagnosis not present

## 2019-03-02 NOTE — Patient Instructions (Addendum)
Patient has mammogram and bilateral ultrasound on Thursday 03/09/19 at 2:20pm with [facility]. Follow-up with our office as needed.Please call and ask to speak with a nurse if you develop questions or concerns.   Breast Cyst  A breast cyst is a sac in the breast that is filled with fluid. Breast cysts are usually noncancerous (benign). They are common among women, and they are most often located in the upper, outer portion of the breast. One or more cysts may develop. They form when fluid builds up inside of the breast glands. There are several types of breast cysts:  Macrocyst. This is a cyst that is about 2 inches (5.1 cm) across (in diameter).  Microcyst. This is a very small cyst that you cannot feel, but it can be seen with imaging tests such as an X-ray of the breast (mammogram) or ultrasound.  Galactocele. This is a cyst that contains milk. It may develop if you suddenly stop breastfeeding. Breast cysts do not increase your risk of breast cancer. They usually disappear after menopause, unless you take artificial hormones (are on hormone therapy). What are the causes? The exact cause of breast cysts is not known. Possible causes include:  Blockage of tubes (ducts) in the breast glands, which leads to fluid buildup. Duct blockage may result from: ? Fibrocystic breast changes. This is a common, benign condition that occurs when women go through hormonal changes during the menstrual cycle. This is a common cause of multiple breast cysts. ? Overgrowth of breast tissue or breast glands. ? Scar tissue in the breast from previous surgery.  Changes in certain female hormones (estrogen and progesterone). What increases the risk? You may be more likely to develop breast cysts if you have not gone through menopause. What are the signs or symptoms? Symptoms of a breast cyst may include:  Feeling one or more smooth, round, soft lumps (like grapes) in the breast that are easily  moveable. The lump(s) may get bigger and more painful before your period and get smaller after your period.  Breast discomfort or pain. How is this diagnosed? A cyst can be felt during a physical exam by your health care provider. A mammogram and ultrasound will be done to confirm the diagnosis. Fluid may be removed from the cyst with a needle (fine-needle aspiration) and tested to make sure the cyst is not cancerous. How is this treated? Treatment may not be necessary. Your health care provider may monitor the cyst to see if it goes away on its own. If the cyst is uncomfortable or gets bigger, or if you do not like how the cyst makes your breast look, you may need treatment. Treatment may include:  Hormone treatment.  Fine-needle aspiration, to drain fluid from the cyst. There is a chance of the cyst coming back (recurring) after aspiration.  Surgery to remove the cyst. Follow these instructions at home:  See your health care provider regularly. ? Get a yearly physical exam. ? If you are 27-51 years old, get a clinical breast exam every 1-3 years. After age 38, get this exam every year. ? Get mammograms as often as directed.  Do a breast self-exam every month, or as often as directed. Having many breast cysts, or "lumpy" breasts, may make it harder to feel for new lumps. Understand how your breasts normally look and feel, and write down any changes in your breasts so you can tell your health care provider about the changes. A breast self-exam involves: ? Comparing  your breasts in the mirror. ? Looking for visible changes in your skin or nipples. ? Feeling for lumps or changes.  Take over-the-counter and prescription medicines only as told by your health care provider.  Wear a supportive bra, especially when exercising.  Follow instructions from your health care provider about eating and drinking restrictions. ? Avoid caffeine. ? Cut down on salt (sodium) in what you eat and drink,  especially before your menstrual period. Too much sodium can cause fluid buildup (retention), breast swelling, and discomfort.  Keep all follow-up visits as told your health care provider. This is important. Contact a health care provider if:  You feel, or think you feel, a lump in your breast.  You notice that both breasts look or feel different than usual.  Your breast is still causing pain after your menstrual period is over.  You find new lumps or bumps that were not there before.  You feel lumps in your armpit (axilla). Get help right away if:  You have severe pain, tenderness, redness, or warmth in your breast.  You have fluid or blood leaking from your nipple.  Your breast lump becomes hard and painful.  You notice dimpling or wrinkling of the breast or nipple. This information is not intended to replace advice given to you by your health care provider. Make sure you discuss any questions you have with your health care provider. Document Released: 05/11/2005 Document Revised: 04/23/2017 Document Reviewed: 01/31/2016 Elsevier Patient Education  2020 [employer].

## 2019-03-03 NOTE — Progress Notes (Signed)
Patient ID: [name], female   DOB: Oct 09, 1965, 53 y.o.   MRN: [id number]  Chief Complaint  Patient presents with  . Breast Nodule    L breast nodules.     HPI [name] is a 53 y.o. female.   She has been followed by this clinic for at least the past 5 years for various lumps and nodules in her left breast.  These have all been considered benign by imaging.  She did have a core needle biopsy of a breast mass in 2013; this was also benign.  She was last seen by Dr. Bary Castilla in February of this year.  She had had a mammogram in December that was BI-RADS 1.  She contacted our office because she was concerned that the upper inner quadrant left breast mass was larger and she thought she felt new masses near her nipple.  Of note, she has a sister with breast cancer and she is therefore extremely vigilant about her own breast health.  She denies any skin changes or drainage from her nipples.  She says the mass in the upper inner quadrant can be tender at times.   Past Medical History:  Diagnosis Date  . High cholesterol     Past Surgical History:  Procedure Laterality Date  . BREAST BIOPSY Right 01-14-12   core    Family History  Problem Relation Age of Onset  . Diabetes Mother   . Diabetes Daughter   . Breast cancer Sister 68  . Hearing loss Neg Hx   . Hypertension Neg Hx   . Hyperlipidemia Neg Hx     Social History Social History   Tobacco Use  . Smoking status: Current Some Day Smoker    Packs/day: 1.00    Years: 30.00    Pack years: 30.00    Types: Cigarettes, E-cigarettes  . Smokeless tobacco: Current User  . Tobacco comment: pt is only smoking 2 cigarettes per day  Substance Use Topics  . Alcohol use: Yes  . Drug use: No    No Known Allergies  Current Outpatient Medications  Medication Sig Dispense Refill  . aspirin 81 MG tablet Take 81 mg by mouth daily.    Marland Kitchen loratadine (CLARITIN) 10 MG tablet Take 10 mg by mouth daily.     No current  facility-administered medications for this visit.     Review of Systems Review of Systems  Constitutional: Negative for chills and fever.  Respiratory: Negative for shortness of breath.   Cardiovascular: Negative for chest pain.  Gastrointestinal: Negative for nausea and vomiting.  Psychiatric/Behavioral: The patient is nervous/anxious.   All other systems reviewed and are negative.   Blood pressure 106/72, pulse 75, temperature (!) 95.7 F (35.4 C), temperature source Temporal, resp. rate 12, height 5\' 5"  (1.651 m), weight 159 lb (72.1 kg), SpO2 97 %. Body mass index is 26.46 kg/m.  Physical Exam Physical Exam Constitutional:      General: She is not in acute distress.    Appearance: Normal appearance. She is normal weight.  HENT:     Head: Normocephalic and atraumatic.     Nose:     Comments: Covered with a mask secondary to COVID-19 precautions Eyes:     General: No scleral icterus.       Right eye: No discharge.        Left eye: No discharge.  Neck:     Musculoskeletal: Normal range of motion.     Comments: No thyromegaly or  dominant thyroid masses palpated Cardiovascular:     Rate and Rhythm: Normal rate and regular rhythm.     Pulses: Normal pulses.  Pulmonary:     Effort: Pulmonary effort is normal.     Breath sounds: Normal breath sounds.  Chest:     Breasts:        Left: Inverted nipple present. No swelling, bleeding or nipple discharge.       Comments: The patient states that the inversion of the left nipple is longstanding.  The overall consistency of the breast tissue is dense. Abdominal:     General: Bowel sounds are normal.     Palpations: Abdomen is soft.  Genitourinary:    Comments: Deferred Musculoskeletal:        General: No swelling or tenderness.  Lymphadenopathy:     Cervical: No cervical adenopathy.     Upper Body:     Right upper body: No supraclavicular, axillary or pectoral adenopathy.     Left upper body: No supraclavicular, axillary  or pectoral adenopathy.  Skin:    General: Skin is warm and dry.  Neurological:     General: No focal deficit present.     Mental Status: She is alert and oriented to person, place, and time.  Psychiatric:        Mood and Affect: Mood normal.        Behavior: Behavior normal.     Data Reviewed I reviewed multiple general surgery notes dating back to 2015.  These do describe the presence of multiple small nodules within the left breast, including several that are in the position that she describes as new, adjacent to the nipple areole or complex.  In Dr. 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  I reviewed the core biopsy results from January 14, 2012, which were benign.  I reviewed mammogram reports dating back to 2013.  The majority of these show benign findings.  There was a single study done in February 2013 that did have a concerning finding which is what led to the core biopsy; the rest have been benign.    Assessment This is a 53 year old woman with a longstanding history of multiple benign breast masses.  Based on my physical exam today as well as review of her exams from prior providers and her radiographic imaging, I feel fairly confident that all of these findings simply represent cysts or other benign masses.  Plan To be completely certain, she will undergo diagnostic imaging of both breasts.  I anticipate that the findings will be benign, but for peace of mind, the patient prefers that we reimage.  I will contact her with the results.  I anticipate, that she will be able to return to routine screening mammography with her primary care provider once these results are back.    Fredirick Maudlin 03/03/2019, 1:07 PM

## 2019-03-09 ENCOUNTER — Ambulatory Visit
Admission: RE | Admit: 2019-03-09 | Discharge: 2019-03-09 | Disposition: A | Discharge status: Home / Self Care | Payer: 59 | Source: Ambulatory Visit | Attending: General Surgery | Admitting: General Surgery

## 2019-03-09 DIAGNOSIS — N6001 Solitary cyst of right breast: Secondary | ICD-10-CM | POA: Diagnosis not present

## 2019-03-09 DIAGNOSIS — N6002 Solitary cyst of left breast: Secondary | ICD-10-CM

## 2019-03-10 ENCOUNTER — Telehealth: Payer: Self-pay | Admitting: General Surgery

## 2019-03-10 NOTE — Telephone Encounter (Signed)
I relayed the benign results of [name]'s breast imaging to her today.

## 2019-07-13 ENCOUNTER — Ambulatory Visit: Payer: Self-pay | Admitting: Family Medicine

## 2019-07-20 ENCOUNTER — Encounter: Payer: Self-pay | Admitting: Family Medicine

## 2019-07-20 ENCOUNTER — Other Ambulatory Visit: Payer: Self-pay | Admitting: Family Medicine

## 2019-07-20 ENCOUNTER — Ambulatory Visit: Payer: 59 | Admitting: Family Medicine

## 2019-07-20 ENCOUNTER — Other Ambulatory Visit: Payer: Self-pay

## 2019-07-20 VITALS — BP 96/67 | HR 60 | Temp 97.5°F | Resp 16 | Ht 65.0 in | Wt 158.0 lb

## 2019-07-20 DIAGNOSIS — R7309 Other abnormal glucose: Secondary | ICD-10-CM

## 2019-07-20 DIAGNOSIS — M940 Chondrocostal junction syndrome [Tietze]: Secondary | ICD-10-CM

## 2019-07-20 DIAGNOSIS — R42 Dizziness and giddiness: Secondary | ICD-10-CM

## 2019-07-20 DIAGNOSIS — Z0000 Encounter for general adult medical examination without abnormal findings: Secondary | ICD-10-CM

## 2019-07-20 DIAGNOSIS — E782 Mixed hyperlipidemia: Secondary | ICD-10-CM

## 2019-07-20 MED ORDER — NAPROXEN 500 MG PO TABS: 500.0000 mg | ORAL_TABLET | Freq: Two times a day (BID) | ORAL | 1 refills | Status: DC

## 2019-07-20 NOTE — Patient Instructions (Addendum)
Thank you for coming to the office today.  Recommend trial of Anti-inflammatory with Naproxen (Naprosyn) 500mg  tabs - take one with food and plenty of water TWICE daily every day (breakfast and dinner), for next 1 to 2 weeks, then you may take only as needed - DO NOT TAKE any ibuprofen, aleve, motrin while you are taking this medicine - It is safe to take Tylenol Ext Str 500mg  tabs - take 1 to 2 (max dose 1000mg ) every 6 hours as needed for breakthrough pain, max 24 hour daily dose is 6 to 8 tablets or 4000mg    DUE for FASTING BLOOD WORK (no food or drink after midnight before the lab appointment, only water or coffee without cream/sugar on the morning of)  SCHEDULE "Lab Only" visit in the morning at the clinic for lab draw in 1 WEEK  - Make sure Lab Only appointment is at about 1 week before your next appointment, so that results will be available  For Lab Results, once available within 2-3 days of blood draw, you can can log in to MyChart online to view your results and a brief explanation. Also, we can discuss results at next follow-up visit.   Please schedule a Follow-up Appointment to: Return in about 1 week (around 07/27/2019) for Annual Physical.  If you have any other questions or concerns, please feel free to call the office or send a message through [place]. You may also schedule an earlier appointment if necessary.  Additionally, you may be receiving a survey about your experience at our office within a few days to 1 week by e-mail or mail. We value your feedback.  Nobie Putnam, DO [place]

## 2019-07-20 NOTE — Progress Notes (Signed)
Subjective:    Patient ID: [name], female    DOB: 09-18-1965, 54 y.o.   MRN: [id number]  [name] is a 54 y.o. female presenting on 07/20/2019 for Dizziness (as per patient she was dehydrated onset last week)   HPI   Dizziness episode / Dehydration - improved Last week acute episode she bent forward and came back up and felt acute dizzy spell, lasted a few minutes, did not pass out. She admitted not hydrating well. She admits drinking more coffee than water. She went inside and drank plenty of water and the dizziness stopped, and has not returned. Doing better now.  Denies any nausea vomiting, fever chills, headache, loss of consciousness, room spinning  Pleuritic Pain / Costochrondritis Chest Wall Pain Reports recently doing more heavy lifting and thinks she caused inflammation in her chest bone, says it is sore to touch across multiple area of front chest, worse with some sharp pains if deep breathing or heavy lifting. Not taking any med for it yet Denies any dyspnea, diaphoresis, chest tightness, radiating pain, numbness tingling, lightheaded    Depression screen [facility] 2/9 07/20/2019 10/19/2017 07/26/2017  Decreased Interest 0 0 0  Down, Depressed, Hopeless 0 0 1  PHQ - 2 Score 0 0 1  Altered sleeping - - 1  Tired, decreased energy - - 2  Change in appetite - - 0  Feeling bad or failure about yourself  - - 0  Trouble concentrating - - 0  Moving slowly or fidgety/restless - - 0  Suicidal thoughts - - 0  PHQ-9 Score - - 4  Difficult doing work/chores - - Somewhat difficult    Social History   Tobacco Use  . Smoking status: Current Some Day Smoker    Packs/day: 1.00    Years: 30.00    Pack years: 30.00    Types: Cigarettes, E-cigarettes  . Smokeless tobacco: Current User  . Tobacco comment: pt is only smoking 2 cigarettes per day  Substance Use Topics  . Alcohol use: Yes  . Drug use: No    Review of Systems Per HPI unless specifically indicated above       Objective:    BP 96/67   Pulse 60   Temp (!) 97.5 F (36.4 C) (Temporal)   Resp 16   Ht 5\' 5"  (1.651 m)   Wt 158 lb (71.7 kg)   BMI 26.29 kg/m   Wt Readings from Last 3 Encounters:  07/20/19 158 lb (71.7 kg)  03/02/19 159 lb (72.1 kg)  06/28/18 170 lb 9.6 oz (77.4 kg)    Physical Exam Vitals and nursing note reviewed.  Constitutional:      General: She is not in acute distress.    Appearance: She is well-developed. She is not diaphoretic.     Comments: Well-appearing, comfortable, cooperative  HENT:     Head: Normocephalic and atraumatic.  Eyes:     General:        Right eye: No discharge.        Left eye: No discharge.     Conjunctiva/sclera: Conjunctivae normal.  Neck:     Thyroid: No thyromegaly.  Cardiovascular:     Rate and Rhythm: Normal rate and regular rhythm.     Heart sounds: Normal heart sounds. No murmur.  Pulmonary:     Effort: Pulmonary effort is normal. No respiratory distress.     Breath sounds: Normal breath sounds. No wheezing or rales.  Chest:     Chest  wall: Tenderness (reproducible mid sternal and left sided , also pleuritic component) present.  Musculoskeletal:        General: Normal range of motion.     Cervical back: Normal range of motion and neck supple.  Lymphadenopathy:     Cervical: No cervical adenopathy.  Skin:    General: Skin is warm and dry.     Findings: No erythema or rash.  Neurological:     Mental Status: She is alert and oriented to person, place, and time.  Psychiatric:        Behavior: Behavior normal.     Comments: Well groomed, good eye contact, normal speech and thoughts    Results for orders placed or performed in visit on 07/26/17  Lipid panel  Result Value Ref Range   Cholesterol, Total 203 (H) 100 - 199 mg/dL   Triglycerides 222 (H) 0 - 149 mg/dL   HDL 31 (L) >39 mg/dL   VLDL Cholesterol Cal 44 (H) 5 - 40 mg/dL   LDL Calculated 128 (H) 0 - 99 mg/dL   Chol/HDL Ratio 6.5 (H) 0.0 - 4.4 ratio  Comprehensive  metabolic panel  Result Value Ref Range   Glucose 94 65 - 99 mg/dL   BUN 17 6 - 24 mg/dL   Creatinine, Ser 0.63 0.57 - 1.00 mg/dL   GFR calc non Af Amer 104 >59 mL/min/1.73   GFR calc Af Amer 120 >59 mL/min/1.73   BUN/Creatinine Ratio 27 (H) 9 - 23   Sodium 142 134 - 144 mmol/L   Potassium 4.7 3.5 - 5.2 mmol/L   Chloride 103 96 - 106 mmol/L   CO2 23 20 - 29 mmol/L   Calcium 9.7 8.7 - 10.2 mg/dL   Total Protein 7.0 6.0 - 8.5 g/dL   Albumin 4.5 3.5 - 5.5 g/dL   Globulin, Total 2.5 1.5 - 4.5 g/dL   Albumin/Globulin Ratio 1.8 1.2 - 2.2   Bilirubin Total 0.3 0.0 - 1.2 mg/dL   Alkaline Phosphatase 90 39 - 117 IU/L   AST 35 0 - 40 IU/L   ALT 46 (H) 0 - 32 IU/L  CBC  Result Value Ref Range   WBC 8.0 3.4 - 10.8 x10E3/uL   RBC 4.46 3.77 - 5.28 x10E6/uL   Hemoglobin 14.4 11.1 - 15.9 g/dL   Hematocrit 42.0 34.0 - 46.6 %   MCV 94 79 - 97 fL   MCH 32.3 26.6 - 33.0 pg   MCHC 34.3 31.5 - 35.7 g/dL   RDW 12.6 12.3 - 15.4 %   Platelets 199 150 - 379 x10E3/uL  Hemoglobin A1c  Result Value Ref Range   Hgb A1c MFr Bld 5.6 4.8 - 5.6 %   Est. average glucose Bld gHb Est-mCnc 114 mg/dL      Assessment & Plan:   Problem List Items Addressed This Visit    None    Visit Diagnoses    Costochondritis    -  Primary   Relevant Medications   naproxen (NAPROSYN) 500 MG tablet   Episode of dizziness         #Dizziness / Dehydration RESOLVED - Acute episode of temporary dizziness, that was self limited , triggered by bending forward quickly, without vertigo or other neuro signs. Resolved w/ improved hydration. She was drinking mostly coffee and history suggestive of dehydration.  #Costochondritis Based on history and exam, reproducible localized area of discomfort sharp pains is most consistent, no associated concerning features for cardiac etiology. Non exertional, no other concerning  features. - Trial NSAID oral Naproxen - Return criteria given if worse or new concerns.    Meds ordered  this encounter  Medications  . naproxen (NAPROSYN) 500 MG tablet    Sig: Take 1 tablet (500 mg total) by mouth 2 (two) times daily with a meal. For 1-2 weeks then as needed    Dispense:  60 tablet    Refill:  1    Follow up plan: Return in about 1 week (around 07/27/2019) for Annual Physical.  Future labs ordered for 07/24/19  Add Thyroid to annual labs  Nobie Putnam, [facility] 07/20/2019, 11:19 AM

## 2019-07-24 ENCOUNTER — Other Ambulatory Visit: Payer: 59

## 2019-07-24 ENCOUNTER — Other Ambulatory Visit: Payer: Self-pay

## 2019-07-24 DIAGNOSIS — R7309 Other abnormal glucose: Secondary | ICD-10-CM

## 2019-07-24 DIAGNOSIS — E782 Mixed hyperlipidemia: Secondary | ICD-10-CM

## 2019-07-24 DIAGNOSIS — Z0000 Encounter for general adult medical examination without abnormal findings: Secondary | ICD-10-CM

## 2019-07-25 LAB — CBC WITH DIFFERENTIAL/PLATELET
Absolute Monocytes: 318 cells/uL (ref 200–950)
Basophils Absolute: 18 cells/uL (ref 0–200)
Basophils Relative: 0.3 %
Eosinophils Absolute: 108 cells/uL (ref 15–500)
Eosinophils Relative: 1.8 %
HCT: 39.3 % (ref 35.0–45.0)
Hemoglobin: 13.3 g/dL (ref 11.7–15.5)
Lymphs Abs: 2202 cells/uL (ref 850–3900)
MCH: 32.4 pg (ref 27.0–33.0)
MCHC: 33.8 g/dL (ref 32.0–36.0)
MCV: 95.9 fL (ref 80.0–100.0)
MPV: 12.3 fL (ref 7.5–12.5)
Monocytes Relative: 5.3 %
Neutro Abs: 3354 cells/uL (ref 1500–7800)
Neutrophils Relative %: 55.9 %
Platelets: 158 10*3/uL (ref 140–400)
RBC: 4.1 10*6/uL (ref 3.80–5.10)
RDW: 11.9 % (ref 11.0–15.0)
Total Lymphocyte: 36.7 %
WBC: 6 10*3/uL (ref 3.8–10.8)

## 2019-07-25 LAB — COMPLETE METABOLIC PANEL WITH GFR
AG Ratio: 1.8 (calc) (ref 1.0–2.5)
ALT: 14 U/L (ref 6–29)
AST: 15 U/L (ref 10–35)
Albumin: 4.1 g/dL (ref 3.6–5.1)
Alkaline phosphatase (APISO): 62 U/L (ref 37–153)
BUN: 18 mg/dL (ref 7–25)
CO2: 28 mmol/L (ref 20–32)
Calcium: 9.1 mg/dL (ref 8.6–10.4)
Chloride: 108 mmol/L (ref 98–110)
Creat: 0.58 mg/dL (ref 0.50–1.05)
GFR, Est African American: 122 mL/min/{1.73_m2} (ref >=60)
GFR, Est Non African American: 105 mL/min/{1.73_m2} (ref >=60)
Globulin: 2.3 g/dL (calc) (ref 1.9–3.7)
Glucose, Bld: 97 mg/dL (ref 65–99)
Potassium: 4.2 mmol/L (ref 3.5–5.3)
Sodium: 143 mmol/L (ref 135–146)
Total Bilirubin: 0.5 mg/dL (ref 0.2–1.2)
Total Protein: 6.4 g/dL (ref 6.1–8.1)

## 2019-07-25 LAB — LIPID PANEL
Cholesterol: 182 mg/dL (ref <200)
HDL: 35 mg/dL — ABNORMAL LOW (ref >=50)
LDL Cholesterol (Calc): 124 mg/dL (calc) — ABNORMAL HIGH
Non-HDL Cholesterol (Calc): 147 mg/dL (calc) — ABNORMAL HIGH (ref <130)
Total CHOL/HDL Ratio: 5.2 (calc) — ABNORMAL HIGH (ref <5.0)
Triglycerides: 124 mg/dL (ref <150)

## 2019-07-25 LAB — HEMOGLOBIN A1C
Hgb A1c MFr Bld: 5.5 % of total Hgb (ref <5.7)
Mean Plasma Glucose: 111 (calc)
eAG (mmol/L): 6.2 (calc)

## 2019-07-25 LAB — T4, FREE: Free T4: 1 ng/dL (ref 0.8–1.8)

## 2019-07-25 LAB — TSH: TSH: 0.9 mIU/L

## 2019-07-27 ENCOUNTER — Ambulatory Visit (INDEPENDENT_AMBULATORY_CARE_PROVIDER_SITE_OTHER): Payer: 59 | Admitting: Family Medicine

## 2019-07-27 ENCOUNTER — Other Ambulatory Visit: Payer: Self-pay

## 2019-07-27 ENCOUNTER — Encounter: Payer: Self-pay | Admitting: Family Medicine

## 2019-07-27 VITALS — BP 119/76 | HR 62 | Temp 97.5°F | Resp 16 | Ht 65.0 in | Wt 157.0 lb

## 2019-07-27 DIAGNOSIS — Z0000 Encounter for general adult medical examination without abnormal findings: Secondary | ICD-10-CM

## 2019-07-27 DIAGNOSIS — E782 Mixed hyperlipidemia: Secondary | ICD-10-CM | POA: Diagnosis not present

## 2019-07-27 NOTE — Patient Instructions (Addendum)
Thank you for coming to the office today.  Keep up the good work. Continue juicing. Continue with improving exercise.  Can try the Naproxen again if need, otherwise we can switch to Meloxicam or other med   Colon Cancer Screening: - For all adults age 54+ routine colon cancer screening is highly recommended.     - Recent guidelines from [place] recommend starting age of 63 - Early detection of colon cancer is important, because often there are no warning signs or symptoms, also if found early usually it can be cured. Late stage is hard to treat.  - If you are not interested in Colonoscopy screening (if done and normal you could be cleared for 5 to 10 years until next due), then Cologuard is an excellent alternative for screening test for Colon Cancer. It is highly sensitive for detecting DNA of colon cancer from even the earliest stages. Also, there is NO bowel prep required. - If Cologuard is NEGATIVE, then it is good for 3 years before next due - If Cologuard is POSITIVE, then it is strongly advised to get a Colonoscopy, which allows the GI doctor to locate the source of the cancer or polyp (even very early stage) and treat it by removing it. ------------------------- If you would like to proceed with Cologuard (stool DNA test) - FIRST, call your insurance company and tell them you want to check cost of Cologuard tell them CPT Code [phone] (it may be completely covered and you could get for no cost, OR max cost without any coverage is about $600). Also, keep in mind if you do NOT open the kit, and decide not to do the test, you will NOT be charged, you should contact the company if you decide not to do the test. - If you want to proceed, you can notify us (phone message, [place], or at next visit) and we will order it for you. The test kit will be delivered to you house within about 1 week. Follow instructions to collect sample, you may call the company for any help or  questions, 24/7 telephone support at [phone].   Please schedule a Follow-up Appointment to: Return in about 1 year (around 07/26/2020) for Annual Physical.  If you have any other questions or concerns, please feel free to call the office or send a message through [place]. You may also schedule an earlier appointment if necessary.  Additionally, you may be receiving a survey about your experience at our office within a few days to 1 week by e-mail or mail. We value your feedback.  Nobie Putnam, DO [place]

## 2019-07-27 NOTE — Progress Notes (Signed)
Subjective:    Patient ID: [name], female    DOB: 15-Sep-1965, 54 y.o.   MRN: [id number]  [name] is a 54 y.o. female presenting on 07/27/2019 for Annual Exam   HPI   Here for Annual Physical and Lab Review  Lifestyle / Overweight BMI >26 Overall doing well. Weight is down. Diet: She does some juicing for nutrition in x 2 daily, makes her own, vegetables and fruits, limiting eating out as much Exercise: Active at home, goal to increase regular exercise  HYPERLIPIDEMIA: - Reports no concerns. Last lipid panel 07/2019 mostly normal with total cholesterol, improved TG, improved LDL at 124. - Not on cholesterol medicine  Tobacco Abuse Quit 3 days ago, but will try to remain smoke free.   Health Maintenance: Colon CA Screening: Never had colonoscopy or other colon screening. Now has insurance, interested in [place], will consider this. Asymptomatic. No fam history.   Depression screen [facility] 2/9 07/27/2019 07/20/2019 10/19/2017  Decreased Interest 0 0 0  Down, Depressed, Hopeless 0 0 0  PHQ - 2 Score 0 0 0  Altered sleeping - - -  Tired, decreased energy - - -  Change in appetite - - -  Feeling bad or failure about yourself  - - -  Trouble concentrating - - -  Moving slowly or fidgety/restless - - -  Suicidal thoughts - - -  PHQ-9 Score - - -  Difficult doing work/chores - - -    Past Medical History:  Diagnosis Date  . High cholesterol    Past Surgical History:  Procedure Laterality Date  . BREAST BIOPSY Right 01-14-12   core   Social History   Socioeconomic History  . Marital status: Married    Spouse name: Not on file  . Number of children: Not on file  . Years of education: Not on file  . Highest education level: Not on file  Occupational History  . Occupation: Architect  Tobacco Use  . Smoking status: Current Some Day Smoker    Packs/day: 1.00    Years: 30.00    Pack years: 30.00    Types: Cigarettes, E-cigarettes  . Smokeless tobacco:  Current User  . Tobacco comment: pt is only smoking 2 cigarettes per day  Substance and Sexual Activity  . Alcohol use: Yes  . Drug use: No  . Sexual activity: Not on file  Other Topics Concern  . Not on file  Social History Narrative  . Not on file   Social Determinants of Health   Financial Resource Strain:   . Difficulty of Paying Living Expenses: Not on file  Food Insecurity:   . Worried About Charity fundraiser in the Last Year: Not on file  . Ran Out of Food in the Last Year: Not on file  Transportation Needs:   . Lack of Transportation (Medical): Not on file  . Lack of Transportation (Non-Medical): Not on file  Physical Activity:   . Days of Exercise per Week: Not on file  . Minutes of Exercise per Session: Not on file  Stress:   . Feeling of Stress : Not on file  Social Connections:   . Frequency of Communication with Friends and Family: Not on file  . Frequency of Social Gatherings with Friends and Family: Not on file  . Attends Religious Services: Not on file  . Active Member of Clubs or Organizations: Not on file  . Attends Archivist Meetings: Not on file  .  Marital Status: Not on file  Intimate Partner Violence:   . Fear of Current or Ex-Partner: Not on file  . Emotionally Abused: Not on file  . Physically Abused: Not on file  . Sexually Abused: Not on file   Family History  Problem Relation Age of Onset  . Diabetes Mother   . Diabetes Daughter   . Breast cancer Sister 3  . Hearing loss Neg Hx   . Hypertension Neg Hx   . Hyperlipidemia Neg Hx    Current Outpatient Medications on File Prior to Visit  Medication Sig  . amoxicillin (AMOXIL) 500 MG capsule Take 500 mg by mouth 3 (three) times daily.  Marland Kitchen aspirin 81 MG tablet Take 81 mg by mouth daily.  . chlorhexidine (PERIDEX) 0.12 % solution SMARTSIG:5-10 Milliliter(s) By Mouth Daily  . HYDROcodone-acetaminophen (NORCO) 7.5-325 MG tablet Take 1 tablet by mouth every 8 (eight) hours as needed.    . loratadine (CLARITIN) 10 MG tablet Take 10 mg by mouth daily.  . naproxen (NAPROSYN) 500 MG tablet Take 1 tablet (500 mg total) by mouth 2 (two) times daily with a meal. For 1-2 weeks then as needed  . ibuprofen (ADVIL) 800 MG tablet Take 800 mg by mouth every 8 (eight) hours as needed.   No current facility-administered medications on file prior to visit.    Review of Systems  Constitutional: Negative for activity change, appetite change, chills, diaphoresis, fatigue and fever.  HENT: Negative for congestion and hearing loss.   Eyes: Negative for visual disturbance.  Respiratory: Negative for apnea, cough, chest tightness, shortness of breath and wheezing.   Cardiovascular: Positive for chest pain (chest wall muscle soreness improving now). Negative for palpitations and leg swelling.  Gastrointestinal: Negative for abdominal pain, anal bleeding, blood in stool, constipation, diarrhea, nausea and vomiting.  Endocrine: Negative for cold intolerance.  Genitourinary: Negative for decreased urine volume, difficulty urinating, dysuria, frequency, hematuria, menstrual problem, pelvic pain and urgency.  Musculoskeletal: Negative for arthralgias, back pain and neck pain.  Skin: Negative for rash.  Allergic/Immunologic: Negative for environmental allergies.  Neurological: Negative for dizziness, weakness, light-headedness, numbness and headaches.  Hematological: Negative for adenopathy.  Psychiatric/Behavioral: Negative for behavioral problems, dysphoric mood and sleep disturbance. The patient is not nervous/anxious.    Per HPI unless specifically indicated above       Objective:    BP 119/76   Pulse 62   Temp (!) 97.5 F (36.4 C) (Oral)   Resp 16   Ht 5\' 5"  (1.651 m)   Wt 157 lb (71.2 kg)   BMI 26.13 kg/m   Wt Readings from Last 3 Encounters:  07/27/19 157 lb (71.2 kg)  07/20/19 158 lb (71.7 kg)  03/02/19 159 lb (72.1 kg)    Physical Exam Vitals and nursing note reviewed.   Constitutional:      General: She is not in acute distress.    Appearance: She is well-developed. She is not diaphoretic.     Comments: Well-appearing, comfortable, cooperative  HENT:     Head: Normocephalic and atraumatic.  Eyes:     General:        Right eye: No discharge.        Left eye: No discharge.     Conjunctiva/sclera: Conjunctivae normal.     Pupils: Pupils are equal, round, and reactive to light.  Neck:     Thyroid: No thyromegaly.  Cardiovascular:     Rate and Rhythm: Normal rate and regular rhythm.  Heart sounds: Normal heart sounds. No murmur.  Pulmonary:     Effort: Pulmonary effort is normal. No respiratory distress.     Breath sounds: Normal breath sounds. No wheezing or rales.  Chest:     Chest wall: Tenderness (reproducible across anterior chest, similar to last visit, some improved now) present.  Abdominal:     General: Bowel sounds are normal. There is no distension.     Palpations: Abdomen is soft. There is no mass.     Tenderness: There is no abdominal tenderness.  Musculoskeletal:        General: No tenderness. Normal range of motion.     Cervical back: Normal range of motion and neck supple.     Comments: Upper / Lower Extremities: - Normal muscle tone, strength bilateral upper extremities 5/5, lower extremities 5/5  Lymphadenopathy:     Cervical: No cervical adenopathy.  Skin:    General: Skin is warm and dry.     Findings: No erythema or rash.  Neurological:     Mental Status: She is alert and oriented to person, place, and time.     Comments: Distal sensation intact to light touch all extremities  Psychiatric:        Behavior: Behavior normal.     Comments: Well groomed, good eye contact, normal speech and thoughts    Results for orders placed or performed in visit on 07/24/19  T4, free  Result Value Ref Range   Free T4 1.0 0.8 - 1.8 ng/dL  TSH  Result Value Ref Range   TSH 0.90 mIU/L  Lipid panel  Result Value Ref Range    Cholesterol 182 <200 mg/dL   HDL 35 (L) > OR = 50 mg/dL   Triglycerides 124 <150 mg/dL   LDL Cholesterol (Calc) 124 (H) mg/dL (calc)   Total CHOL/HDL Ratio 5.2 (H) <5.0 (calc)   Non-HDL Cholesterol (Calc) 147 (H) <130 mg/dL (calc)  COMPLETE METABOLIC PANEL WITH GFR  Result Value Ref Range   Glucose, Bld 97 65 - 99 mg/dL   BUN 18 7 - 25 mg/dL   Creat 0.58 0.50 - 1.05 mg/dL   GFR, Est Non African American 105 > OR = 60 mL/min/1.69m2   GFR, Est African American 122 > OR = 60 mL/min/1.57m2   BUN/Creatinine Ratio NOT APPLICABLE 6 - 22 (calc)   Sodium 143 135 - 146 mmol/L   Potassium 4.2 3.5 - 5.3 mmol/L   Chloride 108 98 - 110 mmol/L   CO2 28 20 - 32 mmol/L   Calcium 9.1 8.6 - 10.4 mg/dL   Total Protein 6.4 6.1 - 8.1 g/dL   Albumin 4.1 3.6 - 5.1 g/dL   Globulin 2.3 1.9 - 3.7 g/dL (calc)   AG Ratio 1.8 1.0 - 2.5 (calc)   Total Bilirubin 0.5 0.2 - 1.2 mg/dL   Alkaline phosphatase (APISO) 62 37 - 153 U/L   AST 15 10 - 35 U/L   ALT 14 6 - 29 U/L  CBC with Differential/Platelet  Result Value Ref Range   WBC 6.0 3.8 - 10.8 Thousand/uL   RBC 4.10 3.80 - 5.10 Million/uL   Hemoglobin 13.3 11.7 - 15.5 g/dL   HCT 39.3 35.0 - 45.0 %   MCV 95.9 80.0 - 100.0 fL   MCH 32.4 27.0 - 33.0 pg   MCHC 33.8 32.0 - 36.0 g/dL   RDW 11.9 11.0 - 15.0 %   Platelets 158 140 - 400 Thousand/uL   MPV 12.3 7.5 - 12.5 fL  Neutro Abs 3,354 1,500 - 7,800 cells/uL   Lymphs Abs 2,202 850 - 3,900 cells/uL   Absolute Monocytes 318 200 - 950 cells/uL   Eosinophils Absolute 108 15 - 500 cells/uL   Basophils Absolute 18 0 - 200 cells/uL   Neutrophils Relative % 55.9 %   Total Lymphocyte 36.7 %   Monocytes Relative 5.3 %   Eosinophils Relative 1.8 %   Basophils Relative 0.3 %  Hemoglobin A1c  Result Value Ref Range   Hgb A1c MFr Bld 5.5 <5.7 % of total Hgb   Mean Plasma Glucose 111 (calc)   eAG (mmol/L) 6.2 (calc)      Assessment & Plan:   Problem List Items Addressed This Visit    Hyperlipidemia      Other Visit Diagnoses    Annual physical exam    -  Primary       Updated Health Maintenance information - Offered Colorectal cancer screening, will defer for now, she will consider Cologuard., handout given - UTD Mammogram Reviewed recent lab results with patient - Normal range A1c 5.5, keep improving - Improved cholesterol - Encourage to quit smoking and remain smoke free Encouraged improvement to lifestyle with diet and exercise - Goal of weight loss  No orders of the defined types were placed in this encounter.     Follow up plan: Return in about 1 year (around 07/26/2020) for Annual Physical.  Nobie Putnam, DO [facility] 07/27/2019, 9:23 AM

## 2019-10-18 ENCOUNTER — Telehealth: Payer: 59 | Admitting: Nurse Practitioner

## 2019-10-18 DIAGNOSIS — L246 Irritant contact dermatitis due to food in contact with skin: Secondary | ICD-10-CM

## 2019-10-18 MED ORDER — PREDNISONE 10 MG (21) PO TBPK: ORAL_TABLET | ORAL | 0 refills | Status: DC

## 2019-10-18 NOTE — Progress Notes (Signed)

## 2019-11-18 ENCOUNTER — Other Ambulatory Visit: Payer: Self-pay | Admitting: Nurse Practitioner

## 2020-02-27 ENCOUNTER — Other Ambulatory Visit: Payer: Self-pay | Admitting: Family Medicine

## 2020-02-27 DIAGNOSIS — Z1231 Encounter for screening mammogram for malignant neoplasm of breast: Secondary | ICD-10-CM

## 2020-03-12 ENCOUNTER — Ambulatory Visit
Admission: RE | Admit: 2020-03-12 | Discharge: 2020-03-12 | Disposition: A | Discharge status: Home / Self Care | Payer: 59 | Source: Ambulatory Visit | Attending: Family Medicine | Admitting: Family Medicine

## 2020-03-12 ENCOUNTER — Other Ambulatory Visit: Payer: Self-pay

## 2020-03-12 DIAGNOSIS — Z1231 Encounter for screening mammogram for malignant neoplasm of breast: Secondary | ICD-10-CM

## 2020-04-05 ENCOUNTER — Ambulatory Visit: Payer: 59

## 2020-04-08 ENCOUNTER — Ambulatory Visit: Payer: 59

## 2020-06-18 ENCOUNTER — Ambulatory Visit: Payer: 59 | Admitting: Family Medicine

## 2020-07-03 ENCOUNTER — Ambulatory Visit: Payer: Self-pay | Admitting: *Deleted

## 2020-07-03 NOTE — Telephone Encounter (Signed)
Pt called with complaints of chest tightness and stiffness x 1 episode on 06/29/20; the episode lasted about 10 sec; she also had stiffness from her left shoulder blade to the middle of backr; her pain was rated 10 out of 10; she had no other episodes; she is pain free at this time; recommendations made per nurse triage protocol; she verbalized understanding; decision tree completed; the pt sees Dr Parks Ranger at [facility] but there is no availability within time recommended time frame; attempted to contact flow coordinator x 1 but no answer; the pt can be contacted at [phone]; will route to office for scheduling.  Reason for Disposition . [1] Chest pain lasting < 5 minutes AND [2] NO chest pain or cardiac symptoms (e.g., breathing difficulty, sweating) now (Exception: chest pains that last only a few seconds)  Answer Assessment - Initial Assessment Questions 1. LOCATION: "Where does it hurt?"    left chest to middle of rib cage 2. RADIATION: "Does the pain go anywhere else?" (e.g., into neck, jaw, arms, back)    Left shoulder blade to middle of back 3. ONSET: "When did the chest pain begin?" (Minutes, hours or days)    06/29/20 4. PATTERN "Does the pain come and go, or has it been constant since it started?"  "Does it get worse with exertion?"     intermittent 5. DURATION: "How long does it last" (e.g., seconds, minutes, hours)    10 sec 6. SEVERITY: "How bad is the pain?"  (e.g., Scale 1-10; mild, moderate, or severe)    - MILD (1-3): doesn't interfere with normal activities     - MODERATE (4-7): interferes with normal activities or awakens from sleep    - SEVERE (8-10): excruciating pain, unable to do any normal activities      10 7. CARDIAC RISK FACTORS: "Do you have any history of heart problems or risk factors for heart disease?" (e.g., angina, prior heart attack; diabetes, high blood pressure, high cholesterol, smoker, or strong family history of heart disease)     no 8.  PULMONARY RISK FACTORS: "Do you have any history of lung disease?"  (e.g., blood clots in lung, asthma, emphysema, birth control pills)   vaping and smoking 9. CAUSE: "What do you think is causing the chest pain?"  not sure 10. OTHER SYMPTOMS: "Do you have any other symptoms?" (e.g., dizziness, nausea, vomiting, sweating, fever, difficulty breathing, cough)       Dizziness during episode 11. PREGNANCY: "Is there any chance you are pregnant?" "When was your last menstrual period?"       No, hysterectomy  Protocols used: CHEST PAIN-A-AH

## 2020-07-03 NOTE — Telephone Encounter (Signed)
The pt was scheduled for an appt on next Tuesday, 07/09/20. She was also informed if her symptoms return to seek emergent care.

## 2020-07-09 ENCOUNTER — Other Ambulatory Visit: Payer: Self-pay

## 2020-07-09 ENCOUNTER — Ambulatory Visit: Payer: 59 | Admitting: Family Medicine

## 2020-07-09 ENCOUNTER — Encounter: Payer: Self-pay | Admitting: Family Medicine

## 2020-07-09 VITALS — BP 112/66 | HR 67 | Ht 64.0 in | Wt 166.4 lb

## 2020-07-09 DIAGNOSIS — R0609 Other forms of dyspnea: Secondary | ICD-10-CM

## 2020-07-09 DIAGNOSIS — R0600 Dyspnea, unspecified: Secondary | ICD-10-CM | POA: Diagnosis not present

## 2020-07-09 DIAGNOSIS — R079 Chest pain, unspecified: Secondary | ICD-10-CM

## 2020-07-09 MED ORDER — NITROGLYCERIN 0.4 MG SL SUBL: 0.4000 mg | SUBLINGUAL_TABLET | SUBLINGUAL | 2 refills | Status: AC | PRN

## 2020-07-09 NOTE — Progress Notes (Signed)
Subjective:    Patient ID: [name], female    DOB: [birth year], 55 y.o.   MRN: [id number]  [name] is a 55 y.o. female presenting on 07/09/2020 for Chest Pain (Tightness on left side and in her shoulder and back)   HPI   Chest Pain Dyspnea on Exertion Acute episode 2/5, AM episode she was in kitchen, sudden acute onset symptoms began with Left sided chest pain and squeezing and tightness. Episode lasted about 10 seconds, then she felt fatigued and tired after that for rest of that day. It has resolved since then, no further episode of acute chest pain or pressure. But she remains fatigued, now she is reporting feeling about 60% of normal energy level, now more active but not able to do as much as she did before. Feels short of breath at times and winded or dyspnea making her fatigued. - She is eager to meet new grandchild in October 2022 - May have seen Cardiology in [place] years ago, has had one ECHO in past. No recent cardiology evaluation. - Admits mild occasional chest pains intermittent.  UTD COVID Vaccine  Depression screen [facility] 2/9 07/27/2019 07/20/2019 10/19/2017  Decreased Interest 0 0 0  Down, Depressed, Hopeless 0 0 0  PHQ - 2 Score 0 0 0  Altered sleeping - - -  Tired, decreased energy - - -  Change in appetite - - -  Feeling bad or failure about yourself  - - -  Trouble concentrating - - -  Moving slowly or fidgety/restless - - -  Suicidal thoughts - - -  PHQ-9 Score - - -  Difficult doing work/chores - - -    Social History   Tobacco Use  . Smoking status: Former Smoker    Packs/day: 1.00    Years: 30.00    Pack years: 30.00    Types: Cigarettes, E-cigarettes    Quit date: 06/25/2020    Years since quitting: 0.0  . Smokeless tobacco: Never Used  Vaping Use  . Vaping Use: Some days  Substance Use Topics  . Alcohol use: Yes  . Drug use: No    Review of Systems Per HPI unless specifically indicated above     Objective:    BP 112/66   Pulse  67   Ht 5\' 4"  (1.626 m)   Wt 166 lb 6.4 oz (75.5 kg)   SpO2 100%   BMI 28.56 kg/m   Wt Readings from Last 3 Encounters:  07/09/20 166 lb 6.4 oz (75.5 kg)  07/27/19 157 lb (71.2 kg)  07/20/19 158 lb (71.7 kg)    Physical Exam Vitals and nursing note reviewed.  Constitutional:      General: She is not in acute distress.    Appearance: She is well-developed and well-nourished. She is not diaphoretic.     Comments: Well-appearing, comfortable, cooperative  HENT:     Head: Normocephalic and atraumatic.     Mouth/Throat:     Mouth: Oropharynx is clear and moist.  Eyes:     General:        Right eye: No discharge.        Left eye: No discharge.     Conjunctiva/sclera: Conjunctivae normal.  Neck:     Thyroid: No thyromegaly.  Cardiovascular:     Rate and Rhythm: Normal rate and regular rhythm.     Pulses: Intact distal pulses.     Heart sounds: Normal heart sounds. No murmur heard.   Pulmonary:  Effort: Pulmonary effort is normal. No respiratory distress.     Breath sounds: Normal breath sounds. No wheezing or rales.  Musculoskeletal:        General: No edema. Normal range of motion.     Cervical back: Normal range of motion and neck supple.  Lymphadenopathy:     Cervical: No cervical adenopathy.  Skin:    General: Skin is warm and dry.     Findings: No erythema or rash.  Neurological:     Mental Status: She is alert and oriented to person, place, and time.  Psychiatric:        Mood and Affect: Mood and affect normal.        Behavior: Behavior normal.     Comments: Well groomed, good eye contact, normal speech and thoughts       Results for orders placed or performed in visit on 07/24/19  T4, free  Result Value Ref Range   Free T4 1.0 0.8 - 1.8 ng/dL  TSH  Result Value Ref Range   TSH 0.90 mIU/L  Lipid panel  Result Value Ref Range   Cholesterol 182 <200 mg/dL   HDL 35 (L) > OR = 50 mg/dL   Triglycerides 124 <150 mg/dL   LDL Cholesterol (Calc) 124 (H)  mg/dL (calc)   Total CHOL/HDL Ratio 5.2 (H) <5.0 (calc)   Non-HDL Cholesterol (Calc) 147 (H) <130 mg/dL (calc)  COMPLETE METABOLIC PANEL WITH GFR  Result Value Ref Range   Glucose, Bld 97 65 - 99 mg/dL   BUN 18 7 - 25 mg/dL   Creat 0.58 0.50 - 1.05 mg/dL   GFR, Est Non African American 105 > OR = 60 mL/min/1.37m2   GFR, Est African American 122 > OR = 60 mL/min/1.67m2   BUN/Creatinine Ratio NOT APPLICABLE 6 - 22 (calc)   Sodium 143 135 - 146 mmol/L   Potassium 4.2 3.5 - 5.3 mmol/L   Chloride 108 98 - 110 mmol/L   CO2 28 20 - 32 mmol/L   Calcium 9.1 8.6 - 10.4 mg/dL   Total Protein 6.4 6.1 - 8.1 g/dL   Albumin 4.1 3.6 - 5.1 g/dL   Globulin 2.3 1.9 - 3.7 g/dL (calc)   AG Ratio 1.8 1.0 - 2.5 (calc)   Total Bilirubin 0.5 0.2 - 1.2 mg/dL   Alkaline phosphatase (APISO) 62 37 - 153 U/L   AST 15 10 - 35 U/L   ALT 14 6 - 29 U/L  CBC with Differential/Platelet  Result Value Ref Range   WBC 6.0 3.8 - 10.8 Thousand/uL   RBC 4.10 3.80 - 5.10 Million/uL   Hemoglobin 13.3 11.7 - 15.5 g/dL   HCT 39.3 35.0 - 45.0 %   MCV 95.9 80.0 - 100.0 fL   MCH 32.4 27.0 - 33.0 pg   MCHC 33.8 32.0 - 36.0 g/dL   RDW 11.9 11.0 - 15.0 %   Platelets 158 140 - 400 Thousand/uL   MPV 12.3 7.5 - 12.5 fL   Neutro Abs 3,354 1,500 - 7,800 cells/uL   Lymphs Abs 2,202 850 - 3,900 cells/uL   Absolute Monocytes 318 200 - 950 cells/uL   Eosinophils Absolute 108 15 - 500 cells/uL   Basophils Absolute 18 0 - 200 cells/uL   Neutrophils Relative % 55.9 %   Total Lymphocyte 36.7 %   Monocytes Relative 5.3 %   Eosinophils Relative 1.8 %   Basophils Relative 0.3 %  Hemoglobin A1c  Result Value Ref Range   Hgb A1c  MFr Bld 5.5 <5.7 % of total Hgb   Mean Plasma Glucose 111 (calc)   eAG (mmol/L) 6.2 (calc)      Assessment & Plan:   Problem List Items Addressed This Visit   None   Visit Diagnoses    Chest pain, unspecified type    -  Primary   Relevant Medications   nitroGLYCERIN (NITROSTAT) 0.4 MG SL tablet    Other Relevant Orders   Ambulatory referral to Cardiology   Dyspnea on exertion       Relevant Medications   nitroGLYCERIN (NITROSTAT) 0.4 MG SL tablet   Other Relevant Orders   Ambulatory referral to Cardiology     Patient with concerning episode about 10 days ago on 2/5 with chest pain and dyspnea  She has known cardiovascular risk factors, former smoker for years, elevated A1c, hyperlipidemia, age >11+  No clear known early fam history of CAD or MI  History of costochondritis but this episode has been different and not reproducible chest wall pain  Still lingering dyspnea and episodic pains.  She admits stress, but overall history is concerning for cardiac etiology  urgent referral to [facility] Dr Ubaldo Glassing (sees her husband as well) for further management, as she is currently hemodynamically stable and chest pain free, but still fatigued and dyspnea on exertion.  Lungs clear, and no other sign of pulmonary cause will defer chest x-ray and further work up today to cardiology.  Rx SL NTG 0.4 PRN chest pain use only if urgent and follow-up promptly  Orders Placed This Encounter  Procedures  . Ambulatory referral to Cardiology    Referral Priority:   Urgent    Referral Type:   Consultation    Referral Reason:   Specialty Services Required    Requested Specialty:   Cardiology    Number of Visits Requested:   1     Meds ordered this encounter  Medications  . nitroGLYCERIN (NITROSTAT) 0.4 MG SL tablet    Sig: Place 1 tablet (0.4 mg total) under the tongue every 5 (five) minutes as needed for chest pain.    Dispense:  30 tablet    Refill:  2     Follow up plan: Return if symptoms worsen or fail to improve.  Nobie Putnam, [facility] 07/09/2020, 10:13 AM

## 2020-07-09 NOTE — Patient Instructions (Addendum)
Thank you for coming to the office today.  Urgent Referral to Dr Conni Elliot Cardiology for further testing  Ordered Nitro sublingual as needed for significant chest pain / dyspnea  If you have any significant chest pain that does not go away within 30 minutes, is accompanied by nausea, sweating, shortness of breath, or made worse by activity, this may be evidence of a heart attack, especially if symptoms worsening instead of improving, please call 911 or go directly to the emergency room immediately for evaluation.   Please schedule a Follow-up Appointment to: Return if symptoms worsen or fail to improve.  If you have any other questions or concerns, please feel free to call the office or send a message through [place]. You may also schedule an earlier appointment if necessary.  Additionally, you may be receiving a survey about your experience at our office within a few days to 1 week by e-mail or mail. We value your feedback.  Nobie Putnam, DO [place]

## 2020-07-18 ENCOUNTER — Ambulatory Visit: Payer: Self-pay | Admitting: Family Medicine

## 2020-11-09 ENCOUNTER — Telehealth: Payer: 59 | Admitting: Nurse Practitioner

## 2020-11-09 DIAGNOSIS — R42 Dizziness and giddiness: Secondary | ICD-10-CM

## 2020-11-09 MED ORDER — MECLIZINE HCL 25 MG PO TABS: 25.0000 mg | ORAL_TABLET | Freq: Three times a day (TID) | ORAL | 0 refills | Status: DC | PRN

## 2020-11-09 NOTE — Progress Notes (Signed)
E Visit for Motion Sickness  We are sorry that you are not feeling well. Here is how we plan to help!  Based on what you have shared with me it looks like you have symptoms of motion sickness.  I have prescribed a medication that will help prevent or alleviate your symptoms:  Meclizine '25mg'$  by mouth three times per day as needed for nausea/motion sickness   Prevention:  You might feel better if you keep your eyes focused on outside while you are in motion. For example, if you are in a car, sit in the front and look in the direction you are moving; if you are on a boat, stay on the deck and look to the horizon. This helps make what you see match the movement you are feeling, and so you are less likely to feel sick.  You should also avoid reading, watching a movie, texting or reading messages, or looking at things close to you inside the vehicle you are riding in.  Use the seat head rest. Lean your head against the back of the seat or head rest when traveling in vehicles with seats to minimize head movements.  On a ship: When making your reservations, choose a cabin in the middle of the ship and near the waterline. When on board, go up on deck and focus on the horizon.  In an airplane: Request a window seat and look out the window. A seat over the front edge of the wing is the most preferable spot (the degree of motion is the lowest here). Direct the air vent to blow cool air on your face.  On a train: Always face forward and sit near a window.  In a vehicle: Sit in the front seat; if you are the passenger, look at the scenery in the distance. For some people, driving the vehicle (rather than being a passenger) is an instant remedy.  Avoid others who have become nauseous with motion sickness. Seeing and smelling others who have motion sickness may cause you to become sick.  GET HELP RIGHT AWAY IF:  Your symptoms do not improve or worsen within 2 days after treatment.  You cannot keep  down fluids after trying the medication.  Other associated symptoms such as severe headache, visual field changes, fever, or intractable nausea and vomiting.  MAKE SURE YOU:  Understand these instructions. Will watch your condition. Will get help right away if you are not doing well or get worse.  Thank you for choosing an e-visit.  Your e-visit answers were reviewed by a board certified advanced clinical practitioner to complete your personal care plan. Depending upon the condition, your plan could have included both over the counter or prescription medications.  Please review your pharmacy choice. Be sure that the pharmacy you have chosen is open so that you can pick up your prescription now.  If there is a problem you may message your provider in [place] to have the prescription routed to another pharmacy.  Your safety is important to [place]. If you have drug allergies check your prescription carefully.   For the next 24 hours, you can use MyChart to ask questions about today's visit, request a non-urgent call back, or ask for a work or school excuse from your e-visit provider.  You will get an e-mail in the next two days asking about your experience. I hope that your e-visit has been valuable and will speed your recovery.   References or for more information: [email or web address].com.ee https://[email or web address].ResearchRoots.be https://www.uptodate.com  5-10 minutes spent reviewing and documenting in chart.  

## 2021-01-29 ENCOUNTER — Other Ambulatory Visit: Payer: Self-pay | Admitting: Family Medicine

## 2021-01-29 DIAGNOSIS — Z1231 Encounter for screening mammogram for malignant neoplasm of breast: Secondary | ICD-10-CM

## 2021-03-03 ENCOUNTER — Encounter: Payer: Self-pay | Admitting: General Surgery

## 2021-03-03 ENCOUNTER — Telehealth: Payer: 59 | Admitting: Physician Assistant

## 2021-03-03 DIAGNOSIS — U071 COVID-19: Secondary | ICD-10-CM

## 2021-03-03 MED ORDER — BENZONATATE 100 MG PO CAPS: 100.0000 mg | ORAL_CAPSULE | Freq: Three times a day (TID) | ORAL | 0 refills | Status: DC | PRN

## 2021-03-03 MED ORDER — PSEUDOEPH-BROMPHEN-DM 30-2-10 MG/5ML PO SYRP: 5.0000 mL | ORAL_SOLUTION | Freq: Four times a day (QID) | ORAL | 0 refills | Status: DC | PRN

## 2021-03-03 MED ORDER — FLUTICASONE PROPIONATE 50 MCG/ACT NA SUSP: 2.0000 | Freq: Every day | NASAL | 0 refills | Status: DC

## 2021-03-03 MED ORDER — MOLNUPIRAVIR EUA 200MG CAPSULE: 4.0000 | ORAL_CAPSULE | Freq: Two times a day (BID) | ORAL | 0 refills | Status: AC

## 2021-03-03 NOTE — Progress Notes (Signed)
Virtual Visit Consent   [name], you are scheduled for a virtual visit with a [facility] provider today.     Just as with appointments in the office, your consent must be obtained to participate.  Your consent will be active for this visit and any virtual visit you may have with one of our providers in the next 365 days.     If you have a MyChart account, a copy of this consent can be sent to you electronically.  All virtual visits are billed to your insurance company just like a traditional visit in the office.    As this is a virtual visit, video technology does not allow for your provider to perform a traditional examination.  This may limit your provider's ability to fully assess your condition.  If your provider identifies any concerns that need to be evaluated in person or the need to arrange testing (such as labs, EKG, etc.), we will make arrangements to do so.     Although advances in technology are sophisticated, we cannot ensure that it will always work on either your end or our end.  If the connection with a video visit is poor, the visit may have to be switched to a telephone visit.  With either a video or telephone visit, we are not always able to ensure that we have a secure connection.     I need to obtain your verbal consent now.   Are you willing to proceed with your visit today?    [name] has provided verbal consent on 03/03/2021 for a virtual visit (video or telephone).   Mar Daring, PA-C   Date: 03/03/2021 1:59 PM   Virtual Visit via Video Note   I, Mar Daring, connected with  [name]  ([id number], 05/03/1966) on 03/03/21 at  1:45 PM EDT by a video-enabled telemedicine application and verified that I am speaking with the correct person using two identifiers.  Location: Patient: Virtual Visit Location Patient: Home Provider: Virtual Visit Location Provider: Home Office   I discussed the limitations of evaluation and management by  telemedicine and the availability of in person appointments. The patient expressed understanding and agreed to proceed.    History of Present Illness: [name] is a 55 y.o. who identifies as a female who was assigned female at birth, and is being seen today for Covid 65.  HPI: URI  This is a new problem. Episode onset: Symptoms started yesterday; tested positive today. The problem has been gradually worsening. Associated symptoms include congestion, coughing, headaches, rhinorrhea, sinus pain and a sore throat. Pertinent negatives include no ear pain or plugged ear sensation. Treatments tried: thera flu, salt water gargles. The treatment provided no relief.     Problems:  Patient Active Problem List   Diagnosis Date Noted   Elevated hemoglobin A1c 08/03/2017   Chronic right-sided low back pain with right-sided sciatica 07/26/2017   DDD (degenerative disc disease), lumbar 07/26/2017   Lipoma of chest wall 05/27/2017   Hyperlipidemia 04/09/2016   Adjustment disorder 04/09/2016   Nicotine dependence 04/09/2016   Colon cancer screening 04/09/2016   Lump or mass in breast 03/08/2013    Allergies: No Known Allergies Medications:  Current Outpatient Medications:    benzonatate (TESSALON) 100 MG capsule, Take 1 capsule (100 mg total) by mouth 3 (three) times daily as needed., Disp: 30 capsule, Rfl: 0   brompheniramine-pseudoephedrine-DM 30-2-10 MG/5ML syrup, Take 5 mLs by mouth 4 (four) times  daily as needed., Disp: 120 mL, Rfl: 0   fluticasone (FLONASE) 50 MCG/ACT nasal spray, Place 2 sprays into both nostrils daily., Disp: 16 g, Rfl: 0   molnupiravir EUA (LAGEVRIO) 200 mg CAPS capsule, Take 4 capsules (800 mg total) by mouth 2 (two) times daily for 5 days., Disp: 40 capsule, Rfl: 0   aspirin 81 MG tablet, Take 81 mg by mouth daily., Disp: , Rfl:    loratadine (CLARITIN) 10 MG tablet, Take 10 mg by mouth daily., Disp: , Rfl:    meclizine (ANTIVERT) 25 MG tablet, Take 1 tablet (25 mg  total) by mouth 3 (three) times daily as needed for dizziness., Disp: 30 tablet, Rfl: 0   nitroGLYCERIN (NITROSTAT) 0.4 MG SL tablet, Place 1 tablet (0.4 mg total) under the tongue every 5 (five) minutes as needed for chest pain., Disp: 30 tablet, Rfl: 2  Observations/Objective: Patient is well-developed, well-nourished in no acute distress.  Resting comfortably at home.  Head is normocephalic, atraumatic.  No labored breathing.  Speech is clear and coherent with logical content.  Patient is alert and oriented at baseline.    Assessment and Plan: 1. COVID-19 - MyChart COVID-19 home monitoring program; Future - fluticasone (FLONASE) 50 MCG/ACT nasal spray; Place 2 sprays into both nostrils daily.  Dispense: 16 g; Refill: 0 - benzonatate (TESSALON) 100 MG capsule; Take 1 capsule (100 mg total) by mouth 3 (three) times daily as needed.  Dispense: 30 capsule; Refill: 0 - brompheniramine-pseudoephedrine-DM 30-2-10 MG/5ML syrup; Take 5 mLs by mouth 4 (four) times daily as needed.  Dispense: 120 mL; Refill: 0 - molnupiravir EUA (LAGEVRIO) 200 mg CAPS capsule; Take 4 capsules (800 mg total) by mouth 2 (two) times daily for 5 days.  Dispense: 40 capsule; Refill: 0  - Continue OTC symptomatic management of choice - Will send OTC vitamins and supplement information through AVS - Molnupiravir prescribed - Bromfed DM, tessalon for cough, flonase for nasal congestion - Patient enrolled in MyChart symptom monitoring - Push fluids - Rest as needed - Discussed return precautions and when to seek in-person evaluation, sent via AVS as well  Follow Up Instructions: I discussed the assessment and treatment plan with the patient. The patient was provided an opportunity to ask questions and all were answered. The patient agreed with the plan and demonstrated an understanding of the instructions.  A copy of instructions were sent to the patient via MyChart unless otherwise noted below.    The patient was  advised to call back or seek an in-person evaluation if the symptoms worsen or if the condition fails to improve as anticipated.  Time:  I spent 15 minutes with the patient via telehealth technology discussing the above problems/concerns.    Mar Daring, PA-C

## 2021-03-03 NOTE — Patient Instructions (Signed)
Hello [name],  You are being placed in the home monitoring program for COVID-19 (commonly known as Coronavirus).  This is because you are suspected to have the virus or are known to have the virus.  If you are unsure which group you fall into call your clinic.    As part of this program, you'll answer a daily questionnaire in the MyChart mobile app. You'll receive a notification through the MyChart app when the questionnaire is available. When you log in to MyChart, you'll see the tasks in your To Do activity.       Clinicians will see any answers that are concerning and take appropriate steps.  If at any point you are having a medical emergency, call 911.  If otherwise concerned call your clinic instead of coming into the clinic or hospital.  To keep from spreading the disease you should: Stay home and limit contact with other people as much as possible.  Wash your hands frequently. Cover your coughs and sneezes with a tissue, and throw used tissues in the trash.   Clean and disinfect frequently touched surfaces and objects.    Take care of yourself by: Staying home Resting Drinking fluids Take fever-reducing medications (Tylenol/Acetaminophen and Ibuprofen)  For more information on the disease go to the Centers for Disease Control and Prevention website     You are being prescribed MOLNUPIRAVIR for COVID-19 infection.   Please call the pharmacy or go through the drive through vs going inside if you are picking up the mediation yourself to prevent further spread. If prescribed to a [facility] affiliated pharmacy, a pharmacist will bring the medication out to your car.   ADMINISTRATION INSTRUCTIONS: Take with or without food. Swallow the tablets whole. Don't chew, crush, or break the medications because it might not work as well  For each dose of the medication, you should be taking FOUR tablets at one time, TWICE a day   Finish your full five-day course of Molnupiravir even if you  feel better before you're done. Stopping this medication too early can make it less effective to prevent severe illness related to [place].    Molnupiravir is prescribed for YOU ONLY. Don't share it with others, even if they have similar symptoms as you. This medication might not be right for everyone.   Make sure to take steps to protect yourself and others while you're taking this medication in order to get well soon and to prevent others from getting sick with COVID-19.   **If you are of childbearing potential (any gender) - it is advised to not get pregnant while taking this medication and recommended that condoms are used for female partners the next 3 months after taking the medication out of extreme caution    COMMON SIDE EFFECTS: Diarrhea Nausea  Dizziness    If your COVID-19 symptoms get worse, get medical help right away. Call 911 if you experience symptoms such as worsening cough, trouble breathing, chest pain that doesn't go away, confusion, a hard time staying awake, and pale or blue-colored skin. This medication won't prevent all COVID-19 cases from getting worse.    Can take to lessen severity: Vit C 500mg  twice daily Quercertin 250-500mg  twice daily Zinc 75-100mg  daily Melatonin 3-6 mg at bedtime Vit D3 1000-2000 IU daily Aspirin 81 mg daily with food Optional: Famotidine 20mg  daily Also can add tylenol/ibuprofen as needed for fevers and body aches May add Mucinex or Mucinex DM as needed for cough/congestion  10 Things You Can  Do to Manage Your COVID-19 Symptoms at Home If you have possible or confirmed COVID-19 Stay home except to get medical care. Monitor your symptoms carefully. If your symptoms get worse, call your healthcare provider immediately. Get rest and stay hydrated. If you have a medical appointment, call the healthcare provider ahead of time and tell them that you have or may have COVID-19. For medical emergencies, call 911 and notify the dispatch  personnel that you have or may have COVID-19. Cover your cough and sneezes with a tissue or use the inside of your elbow. Wash your hands often with soap and water for at least 20 seconds or clean your hands with an alcohol-based hand sanitizer that contains at least 60% alcohol. As much as possible, stay in a specific room and away from other people in your home. Also, you should use a separate bathroom, if available. If you need to be around other people in or outside of the home, wear a mask. Avoid sharing personal items with other people in your household, like dishes, towels, and bedding. Clean all surfaces that are touched often, like counters, tabletops, and doorknobs. Use household cleaning sprays or wipes according to the label instructions. [email or web address] 12/08/2019 This information is not intended to replace advice given to you by your health care provider. Make sure you discuss any questions you have with your health care provider. Document Revised: 09/26/2020 Document Reviewed: 09/26/2020 Elsevier Patient Education  [place].

## 2021-03-04 ENCOUNTER — Telehealth: Payer: 59 | Admitting: Family Medicine

## 2021-03-05 ENCOUNTER — Telehealth: Payer: 59 | Admitting: Family Medicine

## 2021-03-13 ENCOUNTER — Ambulatory Visit
Admission: RE | Admit: 2021-03-13 | Discharge: 2021-03-13 | Disposition: A | Discharge status: Home / Self Care | Payer: 59 | Source: Ambulatory Visit | Attending: Family Medicine | Admitting: Family Medicine

## 2021-03-13 ENCOUNTER — Other Ambulatory Visit: Payer: Self-pay

## 2021-03-13 DIAGNOSIS — Z1231 Encounter for screening mammogram for malignant neoplasm of breast: Secondary | ICD-10-CM | POA: Diagnosis not present

## 2021-04-15 ENCOUNTER — Other Ambulatory Visit: Payer: Self-pay | Admitting: Cardiology

## 2021-04-15 DIAGNOSIS — R0789 Other chest pain: Secondary | ICD-10-CM

## 2021-04-15 DIAGNOSIS — E7800 Pure hypercholesterolemia, unspecified: Secondary | ICD-10-CM

## 2021-04-15 DIAGNOSIS — R9431 Abnormal electrocardiogram [ECG] [EKG]: Secondary | ICD-10-CM

## 2021-04-15 DIAGNOSIS — Z87891 Personal history of nicotine dependence: Secondary | ICD-10-CM

## 2021-04-16 ENCOUNTER — Ambulatory Visit: Payer: 59 | Admitting: Family Medicine

## 2021-04-21 ENCOUNTER — Ambulatory Visit: Payer: 59 | Admitting: Family Medicine

## 2021-04-21 ENCOUNTER — Encounter: Payer: Self-pay | Admitting: Family Medicine

## 2021-04-21 ENCOUNTER — Other Ambulatory Visit: Payer: Self-pay

## 2021-04-21 VITALS — BP 125/76 | HR 61 | Ht 64.0 in | Wt 175.4 lb

## 2021-04-21 DIAGNOSIS — R7309 Other abnormal glucose: Secondary | ICD-10-CM

## 2021-04-21 DIAGNOSIS — E538 Deficiency of other specified B group vitamins: Secondary | ICD-10-CM

## 2021-04-21 DIAGNOSIS — E782 Mixed hyperlipidemia: Secondary | ICD-10-CM | POA: Diagnosis not present

## 2021-04-21 DIAGNOSIS — R5383 Other fatigue: Secondary | ICD-10-CM

## 2021-04-21 DIAGNOSIS — Z23 Encounter for immunization: Secondary | ICD-10-CM | POA: Diagnosis not present

## 2021-04-21 DIAGNOSIS — E669 Obesity, unspecified: Secondary | ICD-10-CM

## 2021-04-21 DIAGNOSIS — E559 Vitamin D deficiency, unspecified: Secondary | ICD-10-CM

## 2021-04-21 NOTE — Progress Notes (Signed)
Subjective:    Patient ID: DORSEY CHARETTE, female    DOB: March 17, 1966, 54 y.o.   MRN: [id number]  [name] is a 55 y.o. female presenting on 04/21/2021 for Fatigue and Weight Gain   HPI  Atypical Chest Pain Hyperlipidemia Followed by Cardiology [facility] Dr Saralyn Pilar, last apt 04/08/21, and they scheduled the Coronary CT 12/8, and she had episode of chest pain that had normal EKG that day and she had to take NTG.  Fatigue PreDiabetes Lifestyle / Obesity BMI >30 Quit smoking and wt gain +20 lbs with inc appetite Exercise: Active at home, goal to increase regular exercise   HYPERLIPIDEMIA: - Reports no concerns. Last lipid panel 07/2019 mostly normal with total cholesterol, improved TG, improved LDL at 124. - Not on cholesterol medicine   Tobacco Abuse - former smoker   Health Maintenance: Colon CA Screening: Never had colonoscopy or other colon screening. Now has insurance, interested in [place], will consider this. Asymptomatic. No fam history.  Mammogram completed 03/13/21. Father's side of family has breast cancer.  Declines Flu Shot this time.  Depression screen [facility] 2/9 04/21/2021 07/27/2019 07/20/2019  Decreased Interest 0 0 0  Down, Depressed, Hopeless 0 0 0  PHQ - 2 Score 0 0 0  Altered sleeping - - -  Tired, decreased energy - - -  Change in appetite - - -  Feeling bad or failure about yourself  - - -  Trouble concentrating - - -  Moving slowly or fidgety/restless - - -  Suicidal thoughts - - -  PHQ-9 Score - - -  Difficult doing work/chores - - -    Social History   Tobacco Use   Smoking status: Former    Packs/day: 1.00    Years: 30.00    Pack years: 30.00    Types: Cigarettes, E-cigarettes    Quit date: 06/25/2020    Years since quitting: 0.8   Smokeless tobacco: Never  Vaping Use   Vaping Use: Some days  Substance Use Topics   Alcohol use: Yes   Drug use: No    Review of Systems Per HPI unless specifically indicated above     Objective:     BP 125/76   Pulse 61   Ht 5\' 4"  (1.626 m)   Wt 175 lb 6.4 oz (79.6 kg)   SpO2 100%   BMI 30.11 kg/m   Wt Readings from Last 3 Encounters:  04/21/21 175 lb 6.4 oz (79.6 kg)  07/09/20 166 lb 6.4 oz (75.5 kg)  07/27/19 157 lb (71.2 kg)    Physical Exam Vitals and nursing note reviewed.  Constitutional:      General: She is not in acute distress.    Appearance: She is well-developed. She is not diaphoretic.     Comments: Well-appearing, comfortable, cooperative  HENT:     Head: Normocephalic and atraumatic.  Eyes:     General:        Right eye: No discharge.        Left eye: No discharge.     Conjunctiva/sclera: Conjunctivae normal.  Neck:     Thyroid: No thyromegaly.  Cardiovascular:     Rate and Rhythm: Normal rate and regular rhythm.     Heart sounds: Normal heart sounds. No murmur heard. Pulmonary:     Effort: Pulmonary effort is normal. No respiratory distress.     Breath sounds: Normal breath sounds. No wheezing or rales.  Musculoskeletal:        General: Normal  range of motion.     Cervical back: Normal range of motion and neck supple.  Lymphadenopathy:     Cervical: No cervical adenopathy.  Skin:    General: Skin is warm and dry.     Findings: No erythema or rash.  Neurological:     Mental Status: She is alert and oriented to person, place, and time.  Psychiatric:        Behavior: Behavior normal.     Comments: Well groomed, good eye contact, normal speech and thoughts      Results for orders placed or performed in visit on 07/24/19  T4, free  Result Value Ref Range   Free T4 1.0 0.8 - 1.8 ng/dL  TSH  Result Value Ref Range   TSH 0.90 mIU/L  Lipid panel  Result Value Ref Range   Cholesterol 182 <200 mg/dL   HDL 35 (L) > OR = 50 mg/dL   Triglycerides 124 <150 mg/dL   LDL Cholesterol (Calc) 124 (H) mg/dL (calc)   Total CHOL/HDL Ratio 5.2 (H) <5.0 (calc)   Non-HDL Cholesterol (Calc) 147 (H) <130 mg/dL (calc)  COMPLETE METABOLIC PANEL WITH GFR   Result Value Ref Range   Glucose, Bld 97 65 - 99 mg/dL   BUN 18 7 - 25 mg/dL   Creat 0.58 0.50 - 1.05 mg/dL   GFR, Est Non African American 105 > OR = 60 mL/min/1.61m2   GFR, Est African American 122 > OR = 60 mL/min/1.21m2   BUN/Creatinine Ratio NOT APPLICABLE 6 - 22 (calc)   Sodium 143 135 - 146 mmol/L   Potassium 4.2 3.5 - 5.3 mmol/L   Chloride 108 98 - 110 mmol/L   CO2 28 20 - 32 mmol/L   Calcium 9.1 8.6 - 10.4 mg/dL   Total Protein 6.4 6.1 - 8.1 g/dL   Albumin 4.1 3.6 - 5.1 g/dL   Globulin 2.3 1.9 - 3.7 g/dL (calc)   AG Ratio 1.8 1.0 - 2.5 (calc)   Total Bilirubin 0.5 0.2 - 1.2 mg/dL   Alkaline phosphatase (APISO) 62 37 - 153 U/L   AST 15 10 - 35 U/L   ALT 14 6 - 29 U/L  CBC with Differential/Platelet  Result Value Ref Range   WBC 6.0 3.8 - 10.8 Thousand/uL   RBC 4.10 3.80 - 5.10 Million/uL   Hemoglobin 13.3 11.7 - 15.5 g/dL   HCT 39.3 35.0 - 45.0 %   MCV 95.9 80.0 - 100.0 fL   MCH 32.4 27.0 - 33.0 pg   MCHC 33.8 32.0 - 36.0 g/dL   RDW 11.9 11.0 - 15.0 %   Platelets 158 140 - 400 Thousand/uL   MPV 12.3 7.5 - 12.5 fL   Neutro Abs 3,354 1,500 - 7,800 cells/uL   Lymphs Abs 2,202 850 - 3,900 cells/uL   Absolute Monocytes 318 200 - 950 cells/uL   Eosinophils Absolute 108 15 - 500 cells/uL   Basophils Absolute 18 0 - 200 cells/uL   Neutrophils Relative % 55.9 %   Total Lymphocyte 36.7 %   Monocytes Relative 5.3 %   Eosinophils Relative 1.8 %   Basophils Relative 0.3 %  Hemoglobin A1c  Result Value Ref Range   Hgb A1c MFr Bld 5.5 <5.7 % of total Hgb   Mean Plasma Glucose 111 (calc)   eAG (mmol/L) 6.2 (calc)      Assessment & Plan:   Problem List Items Addressed This Visit     Hyperlipidemia   Elevated hemoglobin A1c - Primary  Relevant Orders   COMPLETE METABOLIC PANEL WITH GFR   Hemoglobin A1c   Other Visit Diagnoses     Needs flu shot       Relevant Orders   Flu Vaccine QUAD 30mo+IM (Fluarix, Fluzone & Alfiuria Quad PF)   Fatigue, unspecified type        Relevant Orders   COMPLETE METABOLIC PANEL WITH GFR   CBC with Differential/Platelet   Hemoglobin A1c   B12 nutritional deficiency       Relevant Orders   Vitamin B12   Vitamin D deficiency       Relevant Orders   VITAMIN D 25 Hydroxy (Vit-D Deficiency, Fractures)       Fatigue Dyspnea on exertion vs Atypical [place] Pain Followed by Cardiology [facility]  Will check for nutritional deficiency contributing to her fatigue based on dietary history, check for Vitamin D and Vitamin B12 with lab today.  Elevated A1c Obesity BMI >30 Prior history of elevated sugar / pre-diabetes range, overdue for A1c Weight gain with quit smoking Check lab today  Hyperlipidemia Followed by Cardiology Will check fasting lipid NEXT time in 6 months approx for fasting lab.   No orders of the defined types were placed in this encounter.     Follow up plan: Return in about 6 months (around 10/19/2021) for 6 month follow-up PreDM A1c, HLD due for cholesterol.   Nobie Putnam, [facility] 04/21/2021, 1:28 PM

## 2021-04-21 NOTE — Patient Instructions (Addendum)
Thank you for coming to the office today.  Flu Shot today  Future COVID19 Booster, Omicron Bivalent if interested. You are eligible.  Labs today stay tuned on results.  Let me know if questions or if not heard back by next week.  Future we can also check fasting cholesterol maybe 3-6 months depending on our next visit.  Please schedule a Follow-up Appointment to: Return in about 6 months (around 10/19/2021) for 6 month follow-up PreDM A1c, HLD due for cholesterol.  If you have any other questions or concerns, please feel free to call the office or send a message through [place]. You may also schedule an earlier appointment if necessary.  Additionally, you may be receiving a survey about your experience at our office within a few days to 1 week by e-mail or mail. We value your feedback.  Nobie Putnam, DO [facility]

## 2021-04-22 LAB — COMPLETE METABOLIC PANEL WITH GFR
AG Ratio: 1.7 (calc) (ref 1.0–2.5)
ALT: 26 U/L (ref 6–29)
AST: 23 U/L (ref 10–35)
Albumin: 4 g/dL (ref 3.6–5.1)
Alkaline phosphatase (APISO): 69 U/L (ref 37–153)
BUN: 16 mg/dL (ref 7–25)
CO2: 25 mmol/L (ref 20–32)
Calcium: 9.2 mg/dL (ref 8.6–10.4)
Chloride: 109 mmol/L (ref 98–110)
Creat: 0.68 mg/dL (ref 0.50–1.03)
Globulin: 2.4 g/dL (calc) (ref 1.9–3.7)
Glucose, Bld: 102 mg/dL (ref 65–139)
Potassium: 3.9 mmol/L (ref 3.5–5.3)
Sodium: 142 mmol/L (ref 135–146)
Total Bilirubin: 0.4 mg/dL (ref 0.2–1.2)
Total Protein: 6.4 g/dL (ref 6.1–8.1)
eGFR: 103 mL/min/{1.73_m2} (ref >=60)

## 2021-04-22 LAB — CBC WITH DIFFERENTIAL/PLATELET
Absolute Monocytes: 423 cells/uL (ref 200–950)
Basophils Absolute: 33 cells/uL (ref 0–200)
Basophils Relative: 0.5 %
Eosinophils Absolute: 221 cells/uL (ref 15–500)
Eosinophils Relative: 3.4 %
HCT: 40 % (ref 35.0–45.0)
Hemoglobin: 13.4 g/dL (ref 11.7–15.5)
Lymphs Abs: 2522 cells/uL (ref 850–3900)
MCH: 31.4 pg (ref 27.0–33.0)
MCHC: 33.5 g/dL (ref 32.0–36.0)
MCV: 93.7 fL (ref 80.0–100.0)
MPV: 12.2 fL (ref 7.5–12.5)
Monocytes Relative: 6.5 %
Neutro Abs: 3302 cells/uL (ref 1500–7800)
Neutrophils Relative %: 50.8 %
Platelets: 185 10*3/uL (ref 140–400)
RBC: 4.27 10*6/uL (ref 3.80–5.10)
RDW: 12.2 % (ref 11.0–15.0)
Total Lymphocyte: 38.8 %
WBC: 6.5 10*3/uL (ref 3.8–10.8)

## 2021-04-22 LAB — VITAMIN B12: Vitamin B-12: 283 pg/mL (ref 200–1100)

## 2021-04-22 LAB — HEMOGLOBIN A1C
Hgb A1c MFr Bld: 5.5 % of total Hgb (ref <5.7)
Mean Plasma Glucose: 111 mg/dL
eAG (mmol/L): 6.2 mmol/L

## 2021-04-22 LAB — VITAMIN D 25 HYDROXY (VIT D DEFICIENCY, FRACTURES): Vit D, 25-Hydroxy: 38 ng/mL (ref 30–100)

## 2021-04-29 ENCOUNTER — Telehealth (HOSPITAL_COMMUNITY): Payer: Self-pay | Admitting: Emergency Medicine

## 2021-04-29 ENCOUNTER — Encounter (HOSPITAL_COMMUNITY): Payer: Self-pay

## 2021-04-29 NOTE — Telephone Encounter (Signed)
Reaching out to patient to offer assistance regarding upcoming cardiac imaging study; pt verbalizes understanding of appt date/time, parking situation and where to check in, pre-test NPO status and medications ordered, and verified current allergies; name and call back number provided for further questions should they arise Isabella Bond RN Navigator Cardiac Imaging [name] [phone] office [phone] cell  [name] Denies iv issues

## 2021-04-30 ENCOUNTER — Encounter: Payer: Self-pay | Admitting: Family Medicine

## 2021-04-30 DIAGNOSIS — Z1211 Encounter for screening for malignant neoplasm of colon: Secondary | ICD-10-CM

## 2021-05-01 ENCOUNTER — Other Ambulatory Visit: Payer: Self-pay

## 2021-05-01 ENCOUNTER — Ambulatory Visit
Admission: RE | Admit: 2021-05-01 | Discharge: 2021-05-01 | Disposition: A | Discharge status: Home / Self Care | Payer: 59 | Source: Ambulatory Visit | Attending: Cardiology | Admitting: Cardiology

## 2021-05-01 DIAGNOSIS — R0789 Other chest pain: Secondary | ICD-10-CM | POA: Insufficient documentation

## 2021-05-01 DIAGNOSIS — E7800 Pure hypercholesterolemia, unspecified: Secondary | ICD-10-CM | POA: Insufficient documentation

## 2021-05-01 DIAGNOSIS — R9431 Abnormal electrocardiogram [ECG] [EKG]: Secondary | ICD-10-CM | POA: Insufficient documentation

## 2021-05-01 DIAGNOSIS — Z87891 Personal history of nicotine dependence: Secondary | ICD-10-CM | POA: Diagnosis present

## 2021-05-01 MED ORDER — NITROGLYCERIN 0.4 MG SL SUBL
0.4000 mg | SUBLINGUAL_TABLET | Freq: Once | SUBLINGUAL | Status: AC
  Administered 2021-05-01: 0.4 mg via SUBLINGUAL

## 2021-05-01 MED ORDER — IOHEXOL 350 MG/ML SOLN
75.0000 mL | Freq: Once | INTRAVENOUS | Status: AC | PRN
  Administered 2021-05-01: 75 mL via INTRAVENOUS

## 2021-05-01 MED ORDER — METOPROLOL TARTRATE 5 MG/5ML IV SOLN
5.0000 mg | Freq: Once | INTRAVENOUS | Status: AC
  Administered 2021-05-01: 5 mg via INTRAVENOUS

## 2021-05-01 NOTE — Progress Notes (Signed)
Pt tolerated exam without incident; pt denies lightheadedness or dizziness; pt ambulatory to lobby steady gait noted   BP lower post scan- offered coffee. Husband is driving her home  [name]

## 2021-05-01 NOTE — Addendum Note (Signed)
Addended by: Olin Hauser on: 05/01/2021 08:13 AM   Modules accepted: Orders

## 2021-05-06 ENCOUNTER — Encounter: Payer: Self-pay | Admitting: Family Medicine

## 2021-05-13 LAB — COLOGUARD: COLOGUARD: POSITIVE — AB

## 2021-05-14 ENCOUNTER — Encounter: Payer: Self-pay | Admitting: Family Medicine

## 2021-05-14 DIAGNOSIS — R195 Other fecal abnormalities: Secondary | ICD-10-CM

## 2021-05-21 ENCOUNTER — Telehealth: Payer: Self-pay

## 2021-05-21 ENCOUNTER — Other Ambulatory Visit: Payer: Self-pay

## 2021-05-21 DIAGNOSIS — R195 Other fecal abnormalities: Secondary | ICD-10-CM

## 2021-05-21 MED ORDER — CLENPIQ 10-3.5-12 MG-GM -GM/160ML PO SOLN: 1.0000 | ORAL | 0 refills | Status: DC

## 2021-05-21 NOTE — Telephone Encounter (Signed)
Scheduled for 05/27/2021

## 2021-05-21 NOTE — Progress Notes (Signed)
Gastroenterology Pre-Procedure Review  Request Date: 05/27/2021 Requesting Physician: Dr. Marius Ditch  PATIENT REVIEW QUESTIONS: The patient responded to the following health history questions as indicated:    1. Are you having any GI issues? no 2. Do you have a personal history of Polyps? no 3. Do you have a family history of Colon Cancer or Polyps? yes (mother) 4. Diabetes Mellitus? no 5. Joint replacements in the past 12 months?no 6. Major health problems in the past 3 months?no 7. Any artificial heart valves, MVP, or defibrillator?no    MEDICATIONS & ALLERGIES:    Patient reports the following regarding taking any anticoagulation/antiplatelet therapy:   Plavix, Coumadin, Eliquis, Xarelto, Lovenox, Pradaxa, Brilinta, or Effient? no Aspirin? yes (81mg )  Patient confirms/reports the following medications:  Current Outpatient Medications  Medication Sig Dispense Refill   aspirin 81 MG tablet Take 81 mg by mouth daily.     fluticasone (FLONASE) 50 MCG/ACT nasal spray Place 2 sprays into both nostrils daily. 16 g 0   loratadine (CLARITIN) 10 MG tablet Take 10 mg by mouth daily.     nitroGLYCERIN (NITROSTAT) 0.4 MG SL tablet Place 1 tablet (0.4 mg total) under the tongue every 5 (five) minutes as needed for chest pain. 30 tablet 2   No current facility-administered medications for this visit.    Patient confirms/reports the following allergies:  No Known Allergies  No orders of the defined types were placed in this encounter.   AUTHORIZATION INFORMATION Primary Insurance: 1D#: Group #:  Secondary Insurance: 1D#: Group #:  SCHEDULE INFORMATION: Date: 05/27/2021 Time: Location: [facility]

## 2021-05-21 NOTE — Telephone Encounter (Addendum)
Called and got scheduled for 05/27/2021 With vanga I also recommended she may want to call her insurance first before procedure since she had a positive cologuard test patient asked how much a colonoscopy was I siad im sure a few thousand patient stated that's fine please schedule so I did

## 2021-05-27 ENCOUNTER — Ambulatory Visit: Payer: 59 | Admitting: Anesthesiology

## 2021-05-27 ENCOUNTER — Encounter: Payer: Self-pay | Admitting: Gastroenterology

## 2021-05-27 ENCOUNTER — Ambulatory Visit
Admission: RE | Admit: 2021-05-27 | Discharge: 2021-05-27 | Disposition: A | Discharge status: Home / Self Care | Payer: 59 | Attending: Gastroenterology | Admitting: Gastroenterology

## 2021-05-27 ENCOUNTER — Encounter
Admission: RE | Disposition: A | Discharge status: Home / Self Care | Payer: Self-pay | Source: Home / Self Care | Attending: Gastroenterology

## 2021-05-27 DIAGNOSIS — D122 Benign neoplasm of ascending colon: Secondary | ICD-10-CM

## 2021-05-27 DIAGNOSIS — R195 Other fecal abnormalities: Secondary | ICD-10-CM | POA: Diagnosis not present

## 2021-05-27 DIAGNOSIS — Z87891 Personal history of nicotine dependence: Secondary | ICD-10-CM | POA: Insufficient documentation

## 2021-05-27 HISTORY — PX: COLONOSCOPY WITH PROPOFOL: SHX5780

## 2021-05-27 SURGERY — COLONOSCOPY WITH PROPOFOL
Anesthesia: General

## 2021-05-27 MED ORDER — CEFAZOLIN SODIUM-DEXTROSE 1-4 GM/50ML-% IV SOLN
INTRAVENOUS | Status: AC
  Filled 2021-05-27: qty 50

## 2021-05-27 MED ORDER — LIDOCAINE HCL (CARDIAC) PF 100 MG/5ML IV SOSY
PREFILLED_SYRINGE | INTRAVENOUS | Status: DC | PRN
  Administered 2021-05-27: 50 mg via INTRAVENOUS

## 2021-05-27 MED ORDER — PROPOFOL 500 MG/50ML IV EMUL
INTRAVENOUS | Status: DC | PRN
  Administered 2021-05-27: 130 ug/kg/min via INTRAVENOUS

## 2021-05-27 MED ORDER — PROPOFOL 10 MG/ML IV BOLUS
INTRAVENOUS | Status: DC | PRN
  Administered 2021-05-27: 100 mg via INTRAVENOUS

## 2021-05-27 MED ORDER — SODIUM CHLORIDE 0.9 % IV SOLN: INTRAVENOUS | Status: DC

## 2021-05-27 NOTE — Anesthesia Postprocedure Evaluation (Signed)
Anesthesia Post Note  Patient: [name]  Procedure(s) Performed: COLONOSCOPY WITH PROPOFOL  Patient location during evaluation: Endoscopy Anesthesia Type: General Level of consciousness: awake and alert Pain management: pain level controlled Vital Signs Assessment: post-procedure vital signs reviewed and stable Respiratory status: spontaneous breathing, nonlabored ventilation, respiratory function stable and patient connected to nasal cannula oxygen Cardiovascular status: blood pressure returned to baseline and stable Postop Assessment: no apparent nausea or vomiting Anesthetic complications: no   No notable events documented.   Last Vitals:  Vitals:   05/27/21 1040 05/27/21 1050  BP: 93/65 106/62  Pulse: (!) 54 (!) 54  Resp: 14 13  Temp:    SpO2: 100% 100%    Last Pain:  Vitals:   05/27/21 1050  TempSrc:   PainSc: 0-No pain                 Precious Haws Malka Bocek

## 2021-05-27 NOTE — Transfer of Care (Signed)
Immediate Anesthesia Transfer of Care Note  Patient: [name]  Procedure(s) Performed: COLONOSCOPY WITH PROPOFOL  Patient Location: PACU and Endoscopy Unit  Anesthesia Type:General  Level of Consciousness: drowsy and patient cooperative  Airway & Oxygen Therapy: Patient Spontanous Breathing  Post-op Assessment: Report given to RN and Post -op Vital signs reviewed and stable  Post vital signs: Reviewed and stable  Last Vitals:  Vitals Value Taken Time  BP 88/62 05/27/21 1024  Temp 36.4 C 05/27/21 1022  Pulse 68 05/27/21 1024  Resp 14 05/27/21 1024  SpO2 98 % 05/27/21 1024  Vitals shown include unvalidated device data.  Last Pain:  Vitals:   05/27/21 1022  TempSrc: Temporal  PainSc: 0-No pain         Complications: No notable events documented.

## 2021-05-27 NOTE — Anesthesia Preprocedure Evaluation (Signed)
Anesthesia Evaluation  Patient identified by MRN, date of birth, ID band Patient awake    Reviewed: Allergy & Precautions, NPO status , Patient's Chart, lab work & pertinent test results  History of Anesthesia Complications Negative for: history of anesthetic complications  Airway Mallampati: III  TM Distance: >3 FB Neck ROM: full    Dental  (+) Upper Dentures, Lower Dentures   Pulmonary neg shortness of breath, Patient abstained from smoking., former smoker,    Pulmonary exam normal        Cardiovascular Exercise Tolerance: Good (-) angina(-) Past MI negative cardio ROS Normal cardiovascular exam     Neuro/Psych PSYCHIATRIC DISORDERS  Neuromuscular disease negative psych ROS   GI/Hepatic negative GI ROS, Neg liver ROS, neg GERD  ,  Endo/Other  negative endocrine ROS  Renal/GU negative Renal ROS  negative genitourinary   Musculoskeletal  (+) Arthritis ,   Abdominal   Peds  Hematology negative hematology ROS (+)   Anesthesia Other Findings Past Medical History: No date: High cholesterol  Past Surgical History: No date: ABDOMINAL HYSTERECTOMY 01-14-12: BREAST BIOPSY; Right     Comment:  core/clip neg  BMI    Body Mass Index: 29.52 kg/m      Reproductive/Obstetrics negative OB ROS                             Anesthesia Physical Anesthesia Plan  ASA: 2  Anesthesia Plan: General   Post-op Pain Management:    Induction: Intravenous  PONV Risk Score and Plan: Propofol infusion and TIVA  Airway Management Planned: Natural Airway and Nasal Cannula  Additional Equipment:   Intra-op Plan:   Post-operative Plan:   Informed Consent: I have reviewed the patients History and Physical, chart, labs and discussed the procedure including the risks, benefits and alternatives for the proposed anesthesia with the patient or authorized representative who has indicated his/her  understanding and acceptance.     Dental Advisory Given  Plan Discussed with: Anesthesiologist, CRNA and Surgeon  Anesthesia Plan Comments: (Patient consented for risks of anesthesia including but not limited to:  - adverse reactions to medications - risk of airway placement if required - damage to eyes, teeth, lips or other oral mucosa - nerve damage due to positioning  - sore throat or hoarseness - Damage to heart, brain, nerves, lungs, other parts of body or loss of life  Patient voiced understanding.)        Anesthesia Quick Evaluation

## 2021-05-27 NOTE — Op Note (Signed)
[facility] Gastroenterology Patient Name: [name] Procedure Date: 05/27/2021 9:44 AM MRN: [id number] Account #: [id number] Date of Birth: 1966-04-26 Admit Type: Outpatient Age: 56 Room: [facility] ENDO ROOM 3 Gender: Female Note Status: Finalized Instrument Name: [id number] Procedure:             Colonoscopy Indications:           This is the patient's first colonoscopy, Positive                         Cologuard test Providers:             Lin Landsman MD, MD Referring MD:          Olin Hauser (Referring MD) Medicines:             General Anesthesia Complications:         No immediate complications. Estimated blood loss: None. Procedure:             Pre-Anesthesia Assessment:                        - Prior to the procedure, a History and Physical was                         performed, and patient medications and allergies were                         reviewed. The patient is competent. The risks and                         benefits of the procedure and the sedation options and                         risks were discussed with the patient. All questions                         were answered and informed consent was obtained.                         Patient identification and proposed procedure were                         verified by the physician, the nurse, the                         anesthesiologist, the anesthetist and the technician                         in the pre-procedure area in the procedure room in the                         endoscopy suite. Mental Status Examination: alert and                         oriented. Airway Examination: normal oropharyngeal                         airway and neck mobility. Respiratory Examination:  clear to auscultation. CV Examination: normal.                         Prophylactic Antibiotics: The patient does not require                         prophylactic antibiotics. Prior  Anticoagulants: The                         patient has taken no previous anticoagulant or                         antiplatelet agents. ASA Grade Assessment: II - A                         patient with mild systemic disease. After reviewing                         the risks and benefits, the patient was deemed in                         satisfactory condition to undergo the procedure. The                         anesthesia plan was to use general anesthesia.                         Immediately prior to administration of medications,                         the patient was re-assessed for adequacy to receive                         sedatives. The heart rate, respiratory rate, oxygen                         saturations, blood pressure, adequacy of pulmonary                         ventilation, and response to care were monitored                         throughout the procedure. The physical status of the                         patient was re-assessed after the procedure.                        After obtaining informed consent, the colonoscope was                         passed under direct vision. Throughout the procedure,                         the patient's blood pressure, pulse, and oxygen                         saturations were monitored continuously. The  Colonoscope was introduced through the anus and                         advanced to the the cecum, identified by appendiceal                         orifice and ileocecal valve. The colonoscopy was                         performed without difficulty. The patient tolerated                         the procedure well. The quality of the bowel                         preparation was evaluated using the BBPS [facility] Bowel                         Preparation Scale) with scores of: Right Colon = 3,                         Transverse Colon = 3 and Left Colon = 3 (entire mucosa                         seen well with no  residual staining, small fragments                         of stool or opaque liquid). The total BBPS score                         equals 9. Findings:      The perianal and digital rectal examinations were normal. Pertinent       negatives include normal sphincter tone and no palpable rectal lesions.      A 6 mm polyp was found in the ascending colon. The polyp was sessile.       The polyp was removed with a cold snare. Resection and retrieval were       complete. Estimated blood loss: none.      The retroflexed view of the distal rectum and anal verge was normal and       showed no anal or rectal abnormalities.      The exam was otherwise without abnormality. Impression:            - One 6 mm polyp in the ascending colon, removed with                         a cold snare. Resected and retrieved.                        - The distal rectum and anal verge are normal on                         retroflexion view.                        - The examination was otherwise normal. Recommendation:        - Discharge patient to home (with escort).                        -  Resume previous diet today.                        - Continue present medications.                        - Await pathology results.                        - Repeat colonoscopy in 5 to 7 years for surveillance                         based on pathology results. Procedure Code(s):     --- Professional ---                        [phone], Colonoscopy, flexible; with removal of                         tumor(s), polyp(s), or other lesion(s) by snare                         technique Diagnosis Code(s):     --- Professional ---                        K63.5, Polyp of colon                        R19.5, Other fecal abnormalities CPT copyright 2019 American Medical Association. All rights reserved. The codes documented in this report are preliminary and upon coder review may  be revised to meet current compliance requirements. Dr. Ulyess Mort Lin Landsman MD, MD 05/27/2021 10:18:10 AM This report has been signed electronically. Number of Addenda: 0 Note Initiated On: 05/27/2021 9:44 AM Scope Withdrawal Time: 0 hours 11 minutes 23 seconds  Total Procedure Duration: 0 hours 14 minutes 53 seconds  Estimated Blood Loss:  Estimated blood loss: none.      [facility]

## 2021-05-27 NOTE — H&P (Signed)
Cephas Darby, MD [address]  [place]  [place], [place] [address]  Main: [phone]  Fax: [phone] Pager: [phone]  Primary Care Physician:  Olin Hauser, DO Primary Gastroenterologist:  Dr. Cephas Darby  Pre-Procedure History & Physical: HPI:  [name] is a 56 y.o. female is here for an colonoscopy.   Past Medical History:  Diagnosis Date   High cholesterol     Past Surgical History:  Procedure Laterality Date   ABDOMINAL HYSTERECTOMY     BREAST BIOPSY Right 01-14-12   core/clip neg    Prior to Admission medications   Medication Sig Start Date End Date Taking? Authorizing Provider  aspirin 81 MG tablet Take 81 mg by mouth daily.   Yes [provider]  Sod Picosulfate-Mag Ox-Cit Acd (CLENPIQ) 10-3.5-12 MG-GM -GM/160ML SOLN Take 1 kit by mouth as directed. At 5 PM evening before procedure, drink 1 bottle of Clenpiq, hydrate, drink (5) 8 oz of water. Then do the same thing 5 hours prior to your procedure. 05/21/21  Yes Faelynn Wynder, Tally Due, MD  fluticasone (FLONASE) 50 MCG/ACT nasal spray Place 2 sprays into both nostrils daily. 03/03/21   Mar Daring, PA-C  loratadine (CLARITIN) 10 MG tablet Take 10 mg by mouth daily.    [provider]  nitroGLYCERIN (NITROSTAT) 0.4 MG SL tablet Place 1 tablet (0.4 mg total) under the tongue every 5 (five) minutes as needed for chest pain. 07/09/20   Olin Hauser, DO    Allergies as of 05/21/2021   (No Known Allergies)    Family History  Problem Relation Age of Onset   Diabetes Mother    Breast cancer Sister 98   Diabetes Daughter    Breast cancer Paternal Aunt    Breast cancer Paternal Aunt    Hearing loss Neg Hx    Hypertension Neg Hx    Hyperlipidemia Neg Hx     Social History   Socioeconomic History   Marital status: Married    Spouse name: Not on file   Number of children: Not on file   Years of education: Not on file   Highest education level:  Not on file  Occupational History   Occupation: Architect  Tobacco Use   Smoking status: Former    Packs/day: 1.00    Years: 30.00    Pack years: 30.00    Types: Cigarettes, E-cigarettes    Quit date: 06/25/2020    Years since quitting: 0.9   Smokeless tobacco: Never  Vaping Use   Vaping Use: Some days  Substance and Sexual Activity   Alcohol use: Yes   Drug use: No   Sexual activity: Not on file  Other Topics Concern   Not on file  Social History Narrative   Not on file   Social Determinants of Health   Financial Resource Strain: Not on file  Food Insecurity: Not on file  Transportation Needs: Not on file  Physical Activity: Not on file  Stress: Not on file  Social Connections: Not on file  Intimate Partner Violence: Not on file    Review of Systems: See HPI, otherwise negative ROS  Physical Exam: Pulse 65    Temp (!) 96.7 F (35.9 C) (Temporal)    Resp 16    Ht 5' 4"  (1.626 m)    Wt 78 kg    SpO2 99%    BMI 29.52 kg/m  General:   Alert,  pleasant and cooperative in NAD Head:  Normocephalic  and atraumatic. Neck:  Supple; no masses or thyromegaly. Lungs:  Clear throughout to auscultation.    Heart:  Regular rate and rhythm. Abdomen:  Soft, nontender and nondistended. Normal bowel sounds, without guarding, and without rebound.   Neurologic:  Alert and  oriented x4;  grossly normal neurologically.  Impression/Plan: [name] is here for an colonoscopy to be performed for positive cologuard  Risks, benefits, limitations, and alternatives regarding  colonoscopy have been reviewed with the patient.  Questions have been answered.  All parties agreeable.   Sherri Sear, MD  05/27/2021, 9:42 AM

## 2021-05-28 ENCOUNTER — Encounter: Payer: Self-pay | Admitting: Gastroenterology

## 2021-05-28 LAB — SURGICAL PATHOLOGY

## 2022-02-18 ENCOUNTER — Other Ambulatory Visit: Payer: Self-pay | Admitting: Family Medicine

## 2022-02-18 DIAGNOSIS — Z1231 Encounter for screening mammogram for malignant neoplasm of breast: Secondary | ICD-10-CM

## 2022-02-26 ENCOUNTER — Ambulatory Visit (INDEPENDENT_AMBULATORY_CARE_PROVIDER_SITE_OTHER): Payer: 59 | Admitting: Family Medicine

## 2022-02-26 ENCOUNTER — Encounter: Payer: Self-pay | Admitting: Family Medicine

## 2022-02-26 VITALS — BP 107/60 | HR 97 | Ht 64.0 in | Wt 167.0 lb

## 2022-02-26 DIAGNOSIS — R110 Nausea: Secondary | ICD-10-CM | POA: Diagnosis not present

## 2022-02-26 DIAGNOSIS — R1013 Epigastric pain: Secondary | ICD-10-CM | POA: Diagnosis not present

## 2022-02-26 DIAGNOSIS — R634 Abnormal weight loss: Secondary | ICD-10-CM | POA: Diagnosis not present

## 2022-02-26 LAB — CBC WITH DIFFERENTIAL/PLATELET
Absolute Monocytes: 497 cells/uL (ref 200–950)
Basophils Absolute: 29 cells/uL (ref 0–200)
Basophils Relative: 0.4 %
Eosinophils Absolute: 58 cells/uL (ref 15–500)
Eosinophils Relative: 0.8 %
HCT: 41.2 % (ref 35.0–45.0)
Hemoglobin: 14.2 g/dL (ref 11.7–15.5)
Lymphs Abs: 2520 cells/uL (ref 850–3900)
MCH: 32 pg (ref 27.0–33.0)
MCHC: 34.5 g/dL (ref 32.0–36.0)
MCV: 92.8 fL (ref 80.0–100.0)
MPV: 11.9 fL (ref 7.5–12.5)
Monocytes Relative: 6.9 %
Neutro Abs: 4097 cells/uL (ref 1500–7800)
Neutrophils Relative %: 56.9 %
Platelets: 194 10*3/uL (ref 140–400)
RBC: 4.44 10*6/uL (ref 3.80–5.10)
RDW: 12.2 % (ref 11.0–15.0)
Total Lymphocyte: 35 %
WBC: 7.2 10*3/uL (ref 3.8–10.8)

## 2022-02-26 LAB — LIPASE: Lipase: 23 U/L (ref 7–60)

## 2022-02-26 LAB — COMPLETE METABOLIC PANEL WITH GFR
AG Ratio: 1.6 (calc) (ref 1.0–2.5)
ALT: 18 U/L (ref 6–29)
AST: 16 U/L (ref 10–35)
Albumin: 4.4 g/dL (ref 3.6–5.1)
Alkaline phosphatase (APISO): 92 U/L (ref 37–153)
BUN: 23 mg/dL (ref 7–25)
CO2: 30 mmol/L (ref 20–32)
Calcium: 9.7 mg/dL (ref 8.6–10.4)
Chloride: 105 mmol/L (ref 98–110)
Creat: 0.74 mg/dL (ref 0.50–1.03)
Globulin: 2.7 g/dL (calc) (ref 1.9–3.7)
Glucose, Bld: 92 mg/dL (ref 65–99)
Potassium: 4.2 mmol/L (ref 3.5–5.3)
Sodium: 141 mmol/L (ref 135–146)
Total Bilirubin: 0.5 mg/dL (ref 0.2–1.2)
Total Protein: 7.1 g/dL (ref 6.1–8.1)
eGFR: 95 mL/min/{1.73_m2} (ref >=60)

## 2022-02-26 MED ORDER — PANTOPRAZOLE SODIUM 40 MG PO TBEC: 40.0000 mg | DELAYED_RELEASE_TABLET | Freq: Every day | ORAL | 2 refills | Status: DC

## 2022-02-26 MED ORDER — SUCRALFATE 1 G PO TABS: 1.0000 g | ORAL_TABLET | Freq: Three times a day (TID) | ORAL | 2 refills | Status: DC

## 2022-02-26 NOTE — Patient Instructions (Addendum)
Thank you for coming to the office today.  Ultrasound today to check if gallbladder  [facility] in [place], [place] Address: [address] [place], [place] [address] Phone: [phone]  Labs today stay tuned for results   I am not sure the exact cause of your abdominal pain, however I am concerned that one significant possibility could be uncontrolled Acid Reflux (GERD) and may have developed an Ulcer (Peptic Ulcer of stomach).  After you have completed your H Pylori Breath Test today, we can start with the prescribed medicines, and we will notify you of your result and if we have to change treatment.  IF BREATH TEST = NEGATIVE (or waiting for results):  Keep on Pantoprazole Protonix '40mg'$  daily before breakfast Prefer to take this med about 30 min before breakfast or 1st meal of day for 4 weeks, don't stop taking unless we discuss first. Probably will need for about 8 weeks total if this ends up being an Ulcer.   Take other prescribed medicine Carafate (Sucralfate) as needed up to 4 times daily (3 meals and bedtime)  to coat stomach lining to ease symptoms, if it helps reduce symptoms then it is more likely to be due to acid and/or ulcer.  IF BREATH TEST = POSITIVE (we notified you of this result): - CHANGE Protonix dose from '40mg'$  daily to '40mg'$  (one pill) TWICE daily, about 30 min before breakfast and dinner - FOR TWO WEEKS, then resume DAILY treatment - Also we will send in TWO antibiotics to take IN ADDITION: - Amoxicillin 1g TWICE daily + Metronidazole '500mg'$  TID / Clarithromycin '500mg'$  TWICE daily - both for 2 weeks   DIET RECOMMENDATIONS - Avoid spicy, greasy, fried foods, also things like caffeine, dark chocolate, peppermint can worsen - Avoid large meals and late night snacks, also do not go more than 4-5 hours without a snack or meal (not eating will worsen reflux symptoms due to stomach  acid) - You may also elevate the head of your bed at night to sleep at very slight incline to help reduce symptoms   If the problem improves but keeps coming back, we can discuss higher dose or longer course at next visit.   If symptoms are worsening or persistent despite treatment or develop any different severe esophagus or abdominal pain, unable to swallow solids or liquids, nausea, vomiting especially blood in vomit, fever/chills, or unintentional weight loss / no appetite, please follow-up sooner in office or seek more immediate medical attention at hospital Emergency Department.  Regarding other medicines:   - STOP taking Ibuprofen, Advil, Motrin, Goody's / BC powder - DO NOT take without discussing with your doctor. These medicines can put you at high risk for future bleeding.  If need pain medicine, may take Tylenol Extra Strength (Acetaminophen) '500mg'$  tabs - take 1 to 2 tabs per dose (max '1000mg'$ ) every 6-8 hours for pain (take regularly, don't skip a dose for next 7 days), max 24 hour daily dose is 6 tablets or '3000mg'$ . In the future you can repeat the same everyday Tylenol course for 1-2 weeks at a time.    Please schedule a Follow-up Appointment to: Return in about 4 weeks (around 03/26/2022), or if symptoms worsen or fail to improve.  If you have any other questions or concerns, please feel free to call the office or send a message through [place]. You may also schedule an earlier appointment if necessary.  Additionally, you may  be receiving a survey about your experience at our office within a few days to 1 week by e-mail or mail. We value your feedback.  Nobie Putnam, DO [place]

## 2022-02-26 NOTE — Progress Notes (Signed)
Subjective:    Patient ID: [name], female    DOB: 1965/09/30, 56 y.o.   MRN: [id number]  [name] is a 56 y.o. female presenting on 02/26/2022 for Abdominal Pain   HPI  Epigastric Abdominal Pain Unintentional Weight Loss / Poor PO Nausea  Reports new problem over 1 month ago, mid abdomen / epigastric, worse with eating, she has not eaten as much, reduced in take. Weight down 5 lbs in 9 months, uncertain recent wt loss. Drinking more water to stay hydrated but still difficulty. Not taking Ibuprofen, Advil Not consuming alcohol  Strong fam history of GERD PUD also Cholelithiasis gallstones. She has not had prior imaging  She tried Protonix from [place] member 1 day and it improved but didn't continue. Taking TUMS no relief.  Admits nausea Denies fevers chills, dark stool blood in stool, vomiting  Past Surgical History:  Procedure Laterality Date   ABDOMINAL HYSTERECTOMY     BREAST BIOPSY Right 01-14-12   core/clip neg   COLONOSCOPY WITH PROPOFOL N/A 05/27/2021   Procedure: COLONOSCOPY WITH PROPOFOL;  Surgeon: Lin Landsman, MD;  Location: [facility];  Service: Gastroenterology;  Laterality: N/A;        02/26/2022   10:58 AM 04/21/2021    1:37 PM 07/27/2019    9:05 AM  Depression screen PHQ 2/9  Decreased Interest 0 0 0  Down, Depressed, Hopeless 0 0 0  PHQ - 2 Score 0 0 0  Altered sleeping 1    Tired, decreased energy 1    Change in appetite 3    Feeling bad or failure about yourself  0    Trouble concentrating 0    Moving slowly or fidgety/restless 0    Suicidal thoughts 0    PHQ-9 Score 5    Difficult doing work/chores Not difficult at all      Social History   Tobacco Use   Smoking status: Former    Packs/day: 1.00    Years: 30.00    Total pack years: 30.00    Types: Cigarettes, E-cigarettes    Quit date: 06/25/2020    Years since quitting: 1.6   Smokeless tobacco: Never  Vaping Use   Vaping Use: Some days  Substance Use Topics    Alcohol use: Yes   Drug use: No    Review of Systems Per HPI unless specifically indicated above     Objective:    BP 107/60   Pulse 97   Ht '5\' 4"'$  (1.626 m)   Wt 167 lb (75.8 kg)   SpO2 98%   BMI 28.67 kg/m   Wt Readings from Last 3 Encounters:  02/26/22 167 lb (75.8 kg)  05/27/21 172 lb (78 kg)  04/21/21 175 lb 6.4 oz (79.6 kg)    Physical Exam Vitals and nursing note reviewed.  Constitutional:      General: She is not in acute distress.    Appearance: She is well-developed. She is not diaphoretic.     Comments: Well-appearing, comfortable, cooperative  HENT:     Head: Normocephalic and atraumatic.  Eyes:     General:        Right eye: No discharge.        Left eye: No discharge.     Conjunctiva/sclera: Conjunctivae normal.  Neck:     Thyroid: No thyromegaly.  Cardiovascular:     Rate and Rhythm: Normal rate and regular rhythm.     Heart sounds: Normal heart sounds. No murmur heard. Pulmonary:  Effort: Pulmonary effort is normal. No respiratory distress.     Breath sounds: Normal breath sounds. No wheezing or rales.  Abdominal:     General: Bowel sounds are normal. There is no distension.     Palpations: Abdomen is soft. There is no mass.     Tenderness: There is abdominal tenderness (epigastric). There is no guarding.     Hernia: No hernia is present.  Musculoskeletal:        General: Normal range of motion.     Cervical back: Normal range of motion and neck supple.  Lymphadenopathy:     Cervical: No cervical adenopathy.  Skin:    General: Skin is warm and dry.     Findings: No erythema or rash.  Neurological:     Mental Status: She is alert and oriented to person, place, and time.  Psychiatric:        Behavior: Behavior normal.     Comments: Well groomed, good eye contact, normal speech and thoughts    Results for orders placed or performed during the hospital encounter of 05/27/21  Surgical pathology  Result Value Ref Range   SURGICAL PATHOLOGY       SURGICAL PATHOLOGY CASE: [id number] PATIENT: [facility] Surgical Pathology Report     Specimen Submitted: A. Colon polyp, ascending; cold snare  Clinical History: Positive colorectal cancer screening using cologuard test R19.5.  Colon polyp      DIAGNOSIS: A.  COLON POLYP, ASCENDING; COLD SNARE: - SESSILE SERRATED POLYP, INFLAMED. - NEGATIVE FOR DYSPLASIA AND MALIGNANCY.  GROSS DESCRIPTION: A. Labeled: Cold snare ascending colon polyp Received: Formalin Collection time: 10:07 AM on 05/27/2021 Placed into formalin time: 10:07 AM on 05/27/2021 Tissue fragment(s): Multiple Size: Aggregate, 1.5 x 0.5 x 0.1 cm Description: Tan to red soft tissue fragments Entirely submitted in 1 cassette.  [facility] 05/27/2021  Final Diagnosis performed by Quay Burow, MD.   Electronically signed 05/28/2021 9:58:20AM The electronic signature indicates that the named Attending Pathologist has evaluated the specimen Technical component performed at [facility], [address] [place], [place] [address] Lab: [phone] Dir: Rush Farmer, MD, MMM  Professional component performed at [facility], [facility], [place], [place], [place] [address] Lab: [phone] Dir: Kathi Simpers, MD       Assessment & Plan:   Problem List Items Addressed This Visit   None Visit Diagnoses     Epigastric pain    -  Primary   Relevant Medications   pantoprazole (PROTONIX) 40 MG tablet   sucralfate (CARAFATE) 1 g tablet   Other Relevant Orders   COMPLETE METABOLIC PANEL WITH GFR   CBC with Differential/Platelet   Lipase   US ABDOMEN LIMITED RUQ (LIVER/GB)   H. pylori breath test   Nausea       Relevant Orders   US ABDOMEN LIMITED RUQ (LIVER/GB)   H. pylori breath test   Unintentional weight loss       Relevant Orders   COMPLETE METABOLIC PANEL WITH GFR   CBC with Differential/Platelet   Lipase   US ABDOMEN LIMITED RUQ (LIVER/GB)   H. pylori breath test       Suspect  gastric ulcer PUD as likely etiology - history suggestive and fam history strong Cannot rule out Cholelithiasis RUQ biliary colick Not consistent with pancreatitis based on no alcohol or other known trigger Less likely acute appendicitis, given long duration of presentation, no acute abdomen on exam, pain not localized, and not having fever or vomiting.  Plan: 1. Start prolonged PPI trial with Protonix '40mg'$  daily for 4 weeks+ likely 2-3 months 2. Today check H pylori breath test (given in office), confirmed off PPI >2 weeks now - if positive, will increase PPI to BID dosing, add on triple therapy antibiotics with amoxicillin (1g BID) and clarithromycin ('500mg'$  BID) for 2 weeks 3. Start carafate 1g TID WC + QHS PRN for symptom relief 4. Strict return criteria given for worsening symptoms  STAT Labs CBC CMET Lipase today STAT RUQ Abd [place] Ordered for 1-2 days, will be scheduled tomorrow 10/6, eval / rule out gallstones  If worsening symptoms, advise ED evaluation, consider CT imaging if indicated.  Consider GI in future if ultimately determined to be GERD PUD   Meds ordered this encounter  Medications   pantoprazole (PROTONIX) 40 MG tablet    Sig: Take 1 tablet (40 mg total) by mouth daily before breakfast.    Dispense:  30 tablet    Refill:  2   sucralfate (CARAFATE) 1 g tablet    Sig: Take 1 tablet (1 g total) by mouth 4 (four) times daily -  with meals and at bedtime. As needed for abdominal pain    Dispense:  30 tablet    Refill:  2     Follow up plan: Return in about 4 weeks (around 03/26/2022), or if symptoms worsen or fail to improve.   Nobie Putnam, [facility] 02/26/2022, 11:16 AM

## 2022-02-27 ENCOUNTER — Ambulatory Visit: Payer: 59

## 2022-03-03 LAB — H. PYLORI BREATH TEST

## 2022-03-06 ENCOUNTER — Encounter: Payer: Self-pay | Admitting: Family Medicine

## 2022-03-06 ENCOUNTER — Ambulatory Visit: Payer: 59

## 2022-03-06 DIAGNOSIS — R110 Nausea: Secondary | ICD-10-CM

## 2022-03-06 DIAGNOSIS — R1011 Right upper quadrant pain: Secondary | ICD-10-CM

## 2022-03-06 DIAGNOSIS — R1013 Epigastric pain: Secondary | ICD-10-CM

## 2022-03-09 NOTE — Telephone Encounter (Signed)
This is your patient, I have not seen her. I think it was sent to me in error.

## 2022-03-12 ENCOUNTER — Ambulatory Visit: Payer: 59

## 2022-03-12 ENCOUNTER — Other Ambulatory Visit: Payer: Self-pay | Admitting: Family Medicine

## 2022-03-12 DIAGNOSIS — R1011 Right upper quadrant pain: Secondary | ICD-10-CM

## 2022-03-12 DIAGNOSIS — R1013 Epigastric pain: Secondary | ICD-10-CM

## 2022-03-13 ENCOUNTER — Ambulatory Visit
Admission: RE | Admit: 2022-03-13 | Discharge: 2022-03-13 | Disposition: A | Discharge status: Home / Self Care | Payer: 59 | Source: Ambulatory Visit | Attending: Family Medicine | Admitting: Family Medicine

## 2022-03-13 DIAGNOSIS — R1011 Right upper quadrant pain: Secondary | ICD-10-CM | POA: Diagnosis present

## 2022-03-13 DIAGNOSIS — R1013 Epigastric pain: Secondary | ICD-10-CM | POA: Insufficient documentation

## 2022-03-16 ENCOUNTER — Ambulatory Visit
Admission: RE | Admit: 2022-03-16 | Discharge: 2022-03-16 | Disposition: A | Discharge status: Home / Self Care | Payer: 59 | Source: Ambulatory Visit | Attending: Family Medicine | Admitting: Family Medicine

## 2022-03-16 DIAGNOSIS — Z1231 Encounter for screening mammogram for malignant neoplasm of breast: Secondary | ICD-10-CM | POA: Insufficient documentation

## 2022-03-16 NOTE — Addendum Note (Signed)
Addended by: Olin Hauser on: 03/16/2022 08:34 AM   Modules accepted: Orders

## 2022-03-19 NOTE — Progress Notes (Deleted)
Patient ID: [name], female   DOB: May 28, 1965, 56 y.o.   MRN: [id number]  Chief Complaint:  ***  History of Present Illness [name] is a 56 y.o. female with ***.  Past Medical History Past Medical History:  Diagnosis Date   High cholesterol       Past Surgical History:  Procedure Laterality Date   ABDOMINAL HYSTERECTOMY     BREAST BIOPSY Right 01-14-12   core/clip neg   COLONOSCOPY WITH PROPOFOL N/A 05/27/2021   Procedure: COLONOSCOPY WITH PROPOFOL;  Surgeon: Lin Landsman, MD;  Location: [facility];  Service: Gastroenterology;  Laterality: N/A;    No Known Allergies  Current Outpatient Medications  Medication Sig Dispense Refill   aspirin 81 MG tablet Take 81 mg by mouth daily.     fluticasone (FLONASE) 50 MCG/ACT nasal spray Place 2 sprays into both nostrils daily. 16 g 0   loratadine (CLARITIN) 10 MG tablet Take 10 mg by mouth daily.     nitroGLYCERIN (NITROSTAT) 0.4 MG SL tablet Place 1 tablet (0.4 mg total) under the tongue every 5 (five) minutes as needed for chest pain. 30 tablet 2   pantoprazole (PROTONIX) 40 MG tablet Take 1 tablet (40 mg total) by mouth daily before breakfast. 30 tablet 2   sucralfate (CARAFATE) 1 g tablet Take 1 tablet (1 g total) by mouth 4 (four) times daily -  with meals and at bedtime. As needed for abdominal pain 30 tablet 2   No current facility-administered medications for this visit.    Family History Family History  Problem Relation Age of Onset   Diabetes Mother    Breast cancer Sister 70   Diabetes Daughter    Breast cancer Paternal Aunt    Breast cancer Paternal Aunt    Hearing loss Neg Hx    Hypertension Neg Hx    Hyperlipidemia Neg Hx       Social History Social History   Tobacco Use   Smoking status: Former    Packs/day: 1.00    Years: 30.00    Total pack years: 30.00    Types: Cigarettes, E-cigarettes    Quit date: 06/25/2020    Years since quitting: 1.7   Smokeless tobacco: Never  Vaping Use    Vaping Use: Some days  Substance Use Topics   Alcohol use: Yes   Drug use: No        ROS ***   Physical Exam There were no vitals taken for this visit.   CONSTITUTIONAL: Well developed, and nourished, appropriately responsive and aware without distress. ***  EYES: Sclera non-icteric.   EARS, NOSE, MOUTH AND THROAT: Mask worn.  *** The oropharynx is clear. Oral mucosa is pink and moist.  Dentition: ***   Hearing is intact to voice.  NECK: Trachea is midline, and there is no jugular venous distension.  LYMPH NODES:  Lymph nodes in the neck are not enlarged. RESPIRATORY:  Lungs are clear, and breath sounds are equal bilaterally. Normal respiratory effort without pathologic use of accessory muscles. CARDIOVASCULAR: Heart is regular in rate and rhythm. GI: The abdomen is *** soft, nontender, and nondistended. There were no palpable masses. I did not appreciate hepatosplenomegaly. There were normal bowel sounds. GU: *** MUSCULOSKELETAL:  Symmetrical muscle tone appreciated in all four extremities.    SKIN: Skin turgor is normal. No pathologic skin lesions appreciated.  NEUROLOGIC:  Motor and sensation appear grossly normal.  Cranial nerves are grossly without defect. PSYCH:  Alert and oriented  to person, place and time. Affect is appropriate for situation.  Data Reviewed I have personally reviewed what is currently available of the patient's imaging, recent labs and medical records.   Labs:     Latest Ref Rng & Units 02/26/2022   11:40 AM 04/21/2021    1:49 PM 07/24/2019    8:49 AM  CBC  WBC 3.8 - 10.8 Thousand/uL 7.2  6.5  6.0   Hemoglobin 11.7 - 15.5 g/dL 14.2  13.4  13.3   Hematocrit 35.0 - 45.0 % 41.2  40.0  39.3   Platelets 140 - 400 Thousand/uL 194  185  158       Latest Ref Rng & Units 02/26/2022   11:40 AM 04/21/2021    1:49 PM 07/24/2019    8:49 AM  CMP  Glucose 65 - 99 mg/dL 92  102  97   BUN 7 - 25 mg/dL '23  16  18   '$ Creatinine 0.50 - 1.03 mg/dL 0.74  0.68  0.58    Sodium 135 - 146 mmol/L 141  142  143   Potassium 3.5 - 5.3 mmol/L 4.2  3.9  4.2   Chloride 98 - 110 mmol/L 105  109  108   CO2 20 - 32 mmol/L '30  25  28   '$ Calcium 8.6 - 10.4 mg/dL 9.7  9.2  9.1   Total Protein 6.1 - 8.1 g/dL 7.1  6.4  6.4   Total Bilirubin 0.2 - 1.2 mg/dL 0.5  0.4  0.5   AST 10 - 35 U/L '16  23  15   '$ ALT 6 - 29 U/L '18  26  14    '$ *** {Labs :18171}  Imaging: Radiological images reviewed:  CLINICAL DATA:  Four weeks of epigastric pain   EXAM: ULTRASOUND ABDOMEN LIMITED RIGHT UPPER QUADRANT   COMPARISON:  None Available.   FINDINGS: Gallbladder:   No gallstones or wall thickening visualized. No sonographic Murphy sign noted by sonographer.   Common bile duct:   Diameter: 3.2 mm   Liver:   No focal lesion identified. Increased parenchymal echogenicity. Portal vein is patent on color Doppler imaging with normal direction of blood flow towards the liver.   Other: None.   IMPRESSION: 1. Normal sonographic appearance of the gallbladder. 2. Hepatic steatosis.     Electronically Signed   By: Yetta Glassman M.D.   On: 03/13/2022 08:51 Within last 24 hrs: No results found.  Assessment    *** Patient Active Problem List   Diagnosis Date Noted   Positive colorectal cancer screening using Cologuard test    Adenomatous polyp of ascending colon    Elevated hemoglobin A1c 08/03/2017   Chronic right-sided low back pain with right-sided sciatica 07/26/2017   DDD (degenerative disc disease), lumbar 07/26/2017   Lipoma of chest wall 05/27/2017   Hyperlipidemia 04/09/2016   Adjustment disorder 04/09/2016   Nicotine dependence 04/09/2016   Colon cancer screening 04/09/2016   Lump or mass in breast 03/08/2013    Plan    ***  Face-to-face time spent with the patient and accompanying care providers(if present) was *** minutes, with more than 50% of the time spent counseling, educating, and coordinating care of the patient.    These notes generated  with voice recognition software. I apologize for typographical errors.  Ronny Bacon M.D., FACS 03/19/2022, 3:01 PM

## 2022-03-24 ENCOUNTER — Ambulatory Visit: Payer: 59 | Admitting: Surgery

## 2022-05-19 ENCOUNTER — Other Ambulatory Visit: Payer: Self-pay | Admitting: Family Medicine

## 2022-05-19 DIAGNOSIS — R1013 Epigastric pain: Secondary | ICD-10-CM

## 2022-05-21 NOTE — Telephone Encounter (Signed)
Requested medication (s) are due for refill today:Yes  Requested medication (s) are on the active medication list: Yes  Last refill:  02/26/22  Future visit scheduled:No  Notes to clinic:  Unable to refill per protocol, routing for provider approval since this was started on 02/26/22 and noted to return for f/u, not done.     Requested Prescriptions  Pending Prescriptions Disp Refills   pantoprazole (PROTONIX) 40 MG tablet [Pharmacy Med Name: Pantoprazole Sodium 40 MG Oral Tablet Delayed Release] 30 tablet 0    Sig: TAKE 1 TABLET BY MOUTH ONCE DAILY BEFORE BREAKFAST     Gastroenterology: Proton Pump Inhibitors Passed - 05/19/2022  7:07 AM      Passed - Valid encounter within last 12 months    Recent Outpatient Visits           2 months ago Epigastric pain   [place], DO   1 year ago Elevated hemoglobin A1c   [place], DO   1 year ago Chest pain, unspecified type   [place], Devonne Doughty, DO   2 years ago Annual physical exam   [facility] Olin Hauser, DO   2 years ago [place], Devonne Doughty, [place]

## 2022-06-05 ENCOUNTER — Other Ambulatory Visit: Payer: Self-pay | Admitting: Family Medicine

## 2022-06-05 DIAGNOSIS — R1013 Epigastric pain: Secondary | ICD-10-CM

## 2022-06-05 NOTE — Telephone Encounter (Signed)
Requested Prescriptions  Pending Prescriptions Disp Refills   sucralfate (CARAFATE) 1 g tablet [Pharmacy Med Name: Sucralfate 1 GM Oral Tablet] 30 tablet 0    Sig: TAKE 1 TABLET BY MOUTH 4 TIMES DAILY WITH MEALS AND AT BEDTIME AS NEEDED FOR ABDOMINAL PAIN     Gastroenterology: Antiacids Passed - 06/05/2022  7:06 AM      Passed - Valid encounter within last 12 months    Recent Outpatient Visits           3 months ago Epigastric pain   [place], DO   1 year ago Elevated hemoglobin A1c   [place], DO   1 year ago Chest pain, unspecified type   [place], Devonne Doughty, DO   2 years ago Annual physical exam   [facility] Olin Hauser, DO   2 years ago [place], Devonne Doughty, [place]

## 2022-06-12 ENCOUNTER — Other Ambulatory Visit: Payer: Self-pay | Admitting: Family Medicine

## 2022-06-12 DIAGNOSIS — R1013 Epigastric pain: Secondary | ICD-10-CM

## 2022-06-12 NOTE — Telephone Encounter (Signed)
Last reordered 06/05/2022 #30  Requested Prescriptions  Refused Prescriptions Disp Refills   sucralfate (CARAFATE) 1 g tablet [Pharmacy Med Name: Sucralfate 1 GM Oral Tablet] 30 tablet 0    Sig: TAKE 1 TABLET BY MOUTH 4 TIMES DAILY WITH MEALS AND AT BEDTIME AS NEEDED FOR ABDOMINAL PAIN     Gastroenterology: Antiacids Passed - 06/12/2022  7:03 AM      Passed - Valid encounter within last 12 months    Recent Outpatient Visits           3 months ago Epigastric pain   [place], DO   1 year ago Elevated hemoglobin A1c   [place], DO   1 year ago Chest pain, unspecified type   [facility] Olin Hauser, DO   2 years ago Annual physical exam   [facility] Olin Hauser, DO   2 years ago [place], Devonne Doughty, [place]

## 2022-08-31 ENCOUNTER — Encounter: Payer: 59 | Admitting: Family Medicine

## 2022-09-14 ENCOUNTER — Encounter: Payer: Self-pay | Admitting: Family Medicine

## 2022-09-14 ENCOUNTER — Ambulatory Visit (INDEPENDENT_AMBULATORY_CARE_PROVIDER_SITE_OTHER): Payer: 59 | Admitting: Family Medicine

## 2022-09-14 VITALS — BP 99/56 | HR 65 | Ht 64.0 in | Wt 151.0 lb

## 2022-09-14 DIAGNOSIS — Z23 Encounter for immunization: Secondary | ICD-10-CM

## 2022-09-14 DIAGNOSIS — E781 Pure hyperglyceridemia: Secondary | ICD-10-CM

## 2022-09-14 DIAGNOSIS — K219 Gastro-esophageal reflux disease without esophagitis: Secondary | ICD-10-CM | POA: Diagnosis not present

## 2022-09-14 DIAGNOSIS — R7309 Other abnormal glucose: Secondary | ICD-10-CM

## 2022-09-14 DIAGNOSIS — R1013 Epigastric pain: Secondary | ICD-10-CM

## 2022-09-14 DIAGNOSIS — Z0000 Encounter for general adult medical examination without abnormal findings: Secondary | ICD-10-CM

## 2022-09-14 MED ORDER — PANTOPRAZOLE SODIUM 40 MG PO TBEC: 40.0000 mg | DELAYED_RELEASE_TABLET | Freq: Every day | ORAL | 1 refills | Status: DC

## 2022-09-14 NOTE — Patient Instructions (Addendum)
Thank you for coming to the office today.  Labs today. Stay tuned for results.  Refilled Pantoprazole  Let me know if you need more Carafate for stomach acid  TDap tetanus vaccine today, good for 10 years.  Keep up with Mammogram and Colon screening.    DUE for FASTING BLOOD WORK (no food or drink after midnight before the lab appointment, only water or coffee without cream/sugar on the morning of)  TODAY   - Make sure Lab Only appointment is at about 1 week before your next appointment, so that results will be available  For Lab Results, once available within 2-3 days of blood draw, you can can log in to MyChart online to view your results and a brief explanation. Also, we can discuss results at next follow-up visit.   Please schedule a Follow-up Appointment to: No follow-ups on file.  If you have any other questions or concerns, please feel free to call the office or send a message through MyChart. You may also schedule an earlier appointment if necessary.  Additionally, you may be receiving a survey about your experience at our office within a few days to 1 week by e-mail or mail. We value your feedback.  Saralyn Pilar, DO [facility], [place]

## 2022-09-14 NOTE — Progress Notes (Unsigned)
Subjective:    Patient ID: [name], female    DOB: 1965-08-23, 57 y.o.   MRN: [id number]  [name] is a 57 y.o. female presenting on 09/14/2022 for Annual Exam   HPI  Here for Annual Physical and Lab Orders. NON Fasting today.  Elevated A1c Previously, 5 range due for repeat lab  HYPERLIPIDEMIA: - Reports no concerns. Last lipid panel 2022 mostly normal with total cholesterol, improved TG, improved LDL at 124. - Not on cholesterol medicine  Former Smoker Quit 06/25/20  GERD History of PUD Takes Pantoprazole 40mg  intermittently not every day Has carafate AS NEEDED if need.   Health Maintenance:  Tdap vaccine today  Decline Lung CA Screening  Mammogram 02/2023 next due, prior 02/2022 negative  Colonoscopy 05/27/21, repeat 5 years, or 2028      09/14/2022    3:37 PM 02/26/2022   10:58 AM 04/21/2021    1:37 PM  Depression screen PHQ 2/9  Decreased Interest 0 0 0  Down, Depressed, Hopeless 0 0 0  PHQ - 2 Score 0 0 0  Altered sleeping  1   Tired, decreased energy  1   Change in appetite  3   Feeling bad or failure about yourself   0   Trouble concentrating  0   Moving slowly or fidgety/restless  0   Suicidal thoughts  0   PHQ-9 Score  5   Difficult doing work/chores  Not difficult at all     Past Medical History:  Diagnosis Date   High cholesterol    Past Surgical History:  Procedure Laterality Date   ABDOMINAL HYSTERECTOMY     BREAST BIOPSY Right 01-14-12   core/clip neg   COLONOSCOPY WITH PROPOFOL N/A 05/27/2021   Procedure: COLONOSCOPY WITH PROPOFOL;  Surgeon: Toney Reil, MD;  Location: [facility] ENDOSCOPY;  Service: Gastroenterology;  Laterality: N/A;   Social History   Socioeconomic History   Marital status: Divorced    Spouse name: Not on file   Number of children: Not on file   Years of education: Not on file   Highest education level: Not on file  Occupational History   Occupation: Holiday representative  Tobacco Use   Smoking status:  Former    Packs/day: 1.00    Years: 30.00    Additional pack years: 0.00    Total pack years: 30.00    Types: Cigarettes, E-cigarettes    Quit date: 06/25/2020    Years since quitting: 2.2   Smokeless tobacco: Never  Vaping Use   Vaping Use: Some days  Substance and Sexual Activity   Alcohol use: Yes   Drug use: No   Sexual activity: Not on file  Other Topics Concern   Not on file  Social History Narrative   Not on file   Social Determinants of Health   Financial Resource Strain: Not on file  Food Insecurity: Not on file  Transportation Needs: Not on file  Physical Activity: Not on file  Stress: Not on file  Social Connections: Not on file  Intimate Partner Violence: Not on file   Family History  Problem Relation Age of Onset   Diabetes Mother    Breast cancer Sister 68   Diabetes Daughter    Breast cancer Paternal Aunt    Breast cancer Paternal Aunt    Hearing loss Neg Hx    Hypertension Neg Hx    Hyperlipidemia Neg Hx    Current Outpatient Medications on File Prior to Visit  Medication Sig   aspirin 81 MG tablet Take 81 mg by mouth daily.   fluticasone (FLONASE) 50 MCG/ACT nasal spray Place 2 sprays into both nostrils daily.   loratadine (CLARITIN) 10 MG tablet Take 10 mg by mouth daily.   nitroGLYCERIN (NITROSTAT) 0.4 MG SL tablet Place 1 tablet (0.4 mg total) under the tongue every 5 (five) minutes as needed for chest pain.   sucralfate (CARAFATE) 1 g tablet TAKE 1 TABLET BY MOUTH 4 TIMES DAILY WITH MEALS AND AT BEDTIME AS NEEDED FOR ABDOMINAL PAIN   No current facility-administered medications on file prior to visit.    Review of Systems  Constitutional:  Negative for activity change, appetite change, chills, diaphoresis, fatigue and fever.  HENT:  Negative for congestion and hearing loss.   Eyes:  Negative for visual disturbance.  Respiratory:  Negative for cough, chest tightness, shortness of breath and wheezing.   Cardiovascular:  Negative for chest  pain, palpitations and leg swelling.  Gastrointestinal:  Negative for abdominal pain, constipation, diarrhea, nausea and vomiting.  Genitourinary:  Negative for dysuria, frequency and hematuria.  Musculoskeletal:  Negative for arthralgias and neck pain.  Skin:  Negative for rash.  Neurological:  Negative for dizziness, weakness, light-headedness, numbness and headaches.  Hematological:  Negative for adenopathy.  Psychiatric/Behavioral:  Negative for behavioral problems, dysphoric mood and sleep disturbance.    Per HPI unless specifically indicated above      Objective:    BP (!) 99/56   Pulse 65   Ht  (1.626 m)   Wt 151 lb (68.5 kg)   BMI 25.92 kg/m   Wt Readings from Last 3 Encounters:  09/14/22 151 lb (68.5 kg)  02/26/22 167 lb (75.8 kg)  05/27/21 172 lb (78 kg)    Physical Exam Vitals and nursing note reviewed.  Constitutional:      General: She is not in acute distress.    Appearance: She is well-developed. She is not diaphoretic.     Comments: Well-appearing, comfortable, cooperative  HENT:     Head: Normocephalic and atraumatic.  Eyes:     General:        Right eye: No discharge.        Left eye: No discharge.     Conjunctiva/sclera: Conjunctivae normal.     Pupils: Pupils are equal, round, and reactive to light.  Neck:     Thyroid: No thyromegaly.     Vascular: No carotid bruit.  Cardiovascular:     Rate and Rhythm: Normal rate and regular rhythm.     Pulses: Normal pulses.     Heart sounds: Normal heart sounds. No murmur heard. Pulmonary:     Effort: Pulmonary effort is normal. No respiratory distress.     Breath sounds: Normal breath sounds. No wheezing or rales.  Abdominal:     General: Bowel sounds are normal. There is no distension.     Palpations: Abdomen is soft. There is no mass.     Tenderness: There is no abdominal tenderness.  Musculoskeletal:        General: No tenderness. Normal range of motion.     Cervical back: Normal range of motion  and neck supple.     Right lower leg: No edema.     Left lower leg: No edema.     Comments: Upper / Lower Extremities: - Normal muscle tone, strength bilateral upper extremities 5/5, lower extremities 5/5  Lymphadenopathy:     Cervical: No cervical adenopathy.  Skin:  General: Skin is warm and dry.     Findings: No erythema or rash.  Neurological:     Mental Status: She is alert and oriented to person, place, and time.     Comments: Distal sensation intact to light touch all extremities  Psychiatric:        Mood and Affect: Mood normal.        Behavior: Behavior normal.        Thought Content: Thought content normal.     Comments: Well groomed, good eye contact, normal speech and thoughts      Results for orders placed or performed in visit on 02/26/22  COMPLETE METABOLIC PANEL WITH GFR  Result Value Ref Range   Glucose, Bld 92 65 - 99 mg/dL   BUN 23 7 - 25 mg/dL   Creat 1.61 0.96 - 0.45 mg/dL   eGFR 95 > OR = 60 WU/JWJ/1.91Y7   BUN/Creatinine Ratio SEE NOTE: 6 - 22 (calc)   Sodium 141 135 - 146 mmol/L   Potassium 4.2 3.5 - 5.3 mmol/L   Chloride 105 98 - 110 mmol/L   CO2 30 20 - 32 mmol/L   Calcium 9.7 8.6 - 10.4 mg/dL   Total Protein 7.1 6.1 - 8.1 g/dL   Albumin 4.4 3.6 - 5.1 g/dL   Globulin 2.7 1.9 - 3.7 g/dL (calc)   AG Ratio 1.6 1.0 - 2.5 (calc)   Total Bilirubin 0.5 0.2 - 1.2 mg/dL   Alkaline phosphatase (APISO) 92 37 - 153 U/L   AST 16 10 - 35 U/L   ALT 18 6 - 29 U/L  CBC with Differential/Platelet  Result Value Ref Range   WBC 7.2 3.8 - 10.8 Thousand/uL   RBC 4.44 3.80 - 5.10 Million/uL   Hemoglobin 14.2 11.7 - 15.5 g/dL   HCT 82.9 56.2 - 13.0 %   MCV 92.8 80.0 - 100.0 fL   MCH 32.0 27.0 - 33.0 pg   MCHC 34.5 32.0 - 36.0 g/dL   RDW 86.5 78.4 - 69.6 %   Platelets 194 140 - 400 Thousand/uL   MPV 11.9 7.5 - 12.5 fL   Neutro Abs 4,097 1,500 - 7,800 cells/uL   Lymphs Abs 2,520 850 - 3,900 cells/uL   Absolute Monocytes 497 200 - 950 cells/uL   Eosinophils  Absolute 58 15 - 500 cells/uL   Basophils Absolute 29 0 - 200 cells/uL   Neutrophils Relative % 56.9 %   Total Lymphocyte 35.0 %   Monocytes Relative 6.9 %   Eosinophils Relative 0.8 %   Basophils Relative 0.4 %  Lipase  Result Value Ref Range   Lipase 23 7 - 60 U/L  H. pylori breath test  Result Value Ref Range   H. pylori Breath Test CANCELED       Assessment & Plan:   Problem List Items Addressed This Visit     Elevated hemoglobin A1c   Relevant Orders   Hemoglobin A1c   Other Visit Diagnoses     Annual physical exam    -  Primary   Relevant Orders   COMPLETE METABOLIC PANEL WITH GFR   CBC with Differential/Platelet   Hemoglobin A1c   Lipid panel   TSH   Epigastric pain       Relevant Medications   pantoprazole (PROTONIX) 40 MG tablet   Gastroesophageal reflux disease without esophagitis       Relevant Medications   pantoprazole (PROTONIX) 40 MG tablet   Need for diphtheria-tetanus-pertussis (Tdap) vaccine  Relevant Orders   Tdap vaccine greater than or equal to 7yo IM   Hypertriglyceridemia       Relevant Orders   COMPLETE METABOLIC PANEL WITH GFR   CBC with Differential/Platelet   Lipid panel   TSH       Updated Health Maintenance information NON Fasting labs ordered today. Pending results Encouraged improvement to lifestyle with diet and exercise Goal of weight loss  Labs today. Stay tuned for results.  GERD Improved Refilled Pantoprazole Let me know if you need more Carafate for stomach acid  TDap tetanus vaccine today, good for 10 years.  Keep up with Mammogram and Colon screening.  Orders Placed This Encounter  Procedures   Tdap vaccine greater than or equal to 7yo IM   COMPLETE METABOLIC PANEL WITH GFR   CBC with Differential/Platelet   Hemoglobin A1c   Lipid panel    Order Specific Question:   Has the patient fasted?    Answer:   No   TSH     Meds ordered this encounter  Medications   pantoprazole (PROTONIX) 40 MG tablet     Sig: Take 1 tablet (40 mg total) by mouth daily before breakfast.    Dispense:  90 tablet    Refill:  1      Follow up plan: Return in 1 year (on 09/14/2023) for 1 year Annual Physical AM apt fasting lab after.  Saralyn Pilar, DO [facility] 09/14/2022, 3:56 PM

## 2022-09-15 LAB — LIPID PANEL
Cholesterol: 187 mg/dL (ref <200)
HDL: 34 mg/dL — ABNORMAL LOW (ref >=50)
Non-HDL Cholesterol (Calc): 153 mg/dL (calc) — ABNORMAL HIGH (ref <130)
Total CHOL/HDL Ratio: 5.5 (calc) — ABNORMAL HIGH (ref <5.0)
Triglycerides: 438 mg/dL — ABNORMAL HIGH (ref <150)

## 2022-09-15 LAB — COMPLETE METABOLIC PANEL WITH GFR
AG Ratio: 1.6 (calc) (ref 1.0–2.5)
ALT: 10 U/L (ref 6–29)
AST: 13 U/L (ref 10–35)
Albumin: 4.4 g/dL (ref 3.6–5.1)
Alkaline phosphatase (APISO): 81 U/L (ref 37–153)
BUN: 14 mg/dL (ref 7–25)
CO2: 30 mmol/L (ref 20–32)
Calcium: 9.5 mg/dL (ref 8.6–10.4)
Chloride: 106 mmol/L (ref 98–110)
Creat: 0.81 mg/dL (ref 0.50–1.03)
Globulin: 2.8 g/dL (calc) (ref 1.9–3.7)
Glucose, Bld: 88 mg/dL (ref 65–139)
Potassium: 4.4 mmol/L (ref 3.5–5.3)
Sodium: 142 mmol/L (ref 135–146)
Total Bilirubin: 0.3 mg/dL (ref 0.2–1.2)
Total Protein: 7.2 g/dL (ref 6.1–8.1)
eGFR: 85 mL/min/{1.73_m2} (ref >=60)

## 2022-09-15 LAB — CBC WITH DIFFERENTIAL/PLATELET
Absolute Monocytes: 554 cells/uL (ref 200–950)
Basophils Absolute: 34 cells/uL (ref 0–200)
Basophils Relative: 0.4 %
Eosinophils Absolute: 59 cells/uL (ref 15–500)
Eosinophils Relative: 0.7 %
HCT: 42.6 % (ref 35.0–45.0)
Hemoglobin: 14.5 g/dL (ref 11.7–15.5)
Lymphs Abs: 2671 cells/uL (ref 850–3900)
MCH: 31.6 pg (ref 27.0–33.0)
MCHC: 34 g/dL (ref 32.0–36.0)
MCV: 92.8 fL (ref 80.0–100.0)
MPV: 11.7 fL (ref 7.5–12.5)
Monocytes Relative: 6.6 %
Neutro Abs: 5082 cells/uL (ref 1500–7800)
Neutrophils Relative %: 60.5 %
Platelets: 214 10*3/uL (ref 140–400)
RBC: 4.59 10*6/uL (ref 3.80–5.10)
RDW: 11.7 % (ref 11.0–15.0)
Total Lymphocyte: 31.8 %
WBC: 8.4 10*3/uL (ref 3.8–10.8)

## 2022-09-15 LAB — TSH: TSH: 0.84 mIU/L (ref 0.40–4.50)

## 2022-09-15 LAB — HEMOGLOBIN A1C
Hgb A1c MFr Bld: 5.6 % of total Hgb (ref <5.7)
Mean Plasma Glucose: 114 mg/dL
eAG (mmol/L): 6.3 mmol/L

## 2022-09-25 ENCOUNTER — Ambulatory Visit: Payer: Self-pay | Admitting: *Deleted

## 2022-09-25 NOTE — Telephone Encounter (Signed)
  Chief Complaint: bleeding from right side of nose and passed a blood clot.   It's stopped bleeding now.   "I've just never had a nose bleed before". Symptoms: No bleeding now.    No injuries, URI symptoms, no pain.   Frequency: Today one time a few minutes ago.   I sneezed about 15 times right before my nose started bleeding and then this clot came out of the right side of my nose.  Pertinent Negatives: Patient denies it bleeding now.   I went over how to stop the bleeding if it starts again.   Not on blood thinners. Disposition: [] ED /[] Urgent Care (no appt availability in office) / [] Appointment(In office/virtual)/ []  [place] [facility]/ [x] Home Care/ [] Refused Recommended Disposition /[] [place] [facility]/ []  Follow-up with PCP Additional Notes: Advised to go to the ED over the weekend if it happens again and she can't get it stopped bleeding after I went over the correct technique how to stop the bleeding in the care advice.  She verbalized understanding.

## 2022-09-25 NOTE — Telephone Encounter (Signed)
Reason for Disposition  [1] Mild-moderate nosebleed AND [2] bleeding stopped now  Answer Assessment - Initial Assessment Questions 1. AMOUNT OF BLEEDING: "How bad is the bleeding?" "How much blood was lost?" "Has the bleeding stopped?"   - MILD: needed a couple tissues   - MODERATE: needed many tissues   - SEVERE: large blood clots, soaked many tissues, lasted more than 30 minutes      My nose is bleeding from my right side.   Also clots in it.   My blood work is good so I don't know what is causing.    I was sick with fever 103 the other day but I'm fine.    2. ONSET: "When did the nosebleed start?"      Today I have low BP.    3. FREQUENCY: "How many nosebleeds have you had in the last 24 hours?"      Just this one. 4. RECURRENT SYMPTOMS: "Have there been other recent nosebleeds?" If Yes, ask: "How long did it take you to stop the bleeding?" "What worked best?"      No     It's stopped after the clot came out.   I sneezed 15 times right before that.    5. CAUSE: "What do you think caused this nosebleed?"     I have no idea.   I've never had a nose bleed before. 6. LOCAL FACTORS: "Do you have any cold symptoms?", "Have you been rubbing or picking at your nose?"     No  No nasal congestion, I take an antihistamine for my allergies so they are not bothering me.    I had a fever of 103 for a day a few days ago and so did my granddaughter but we both got over it in a day.   No other symptoms. 7. SYSTEMIC FACTORS: "Do you have high blood pressure or any bleeding problems?"     No I run a low BP systolic in 90s is my normal. 8. BLOOD THINNERS: "Do you take any blood thinners?" (e.g., aspirin, clopidogrel / Plavix, coumadin, heparin). Notes: Other strong blood thinners include: Arixtra (fondaparinux), Eliquis (apixaban), Pradaxa (dabigatran), and Xarelto (rivaroxaban).     Not on blood thinners. 9. OTHER SYMPTOMS: "Do you have any other symptoms?" (e.g., lightheadedness)     No other symptoms  except I sneezed like 15 times right before my nose started bleeding and this big clot came out.   I've got the bleeding stopped. 10. PREGNANCY: "Is there any chance you are pregnant?" "When was your last menstrual period?"       Not asked  Protocols used: Nosebleed-A-AH

## 2022-10-21 ENCOUNTER — Ambulatory Visit: Payer: Self-pay | Admitting: *Deleted

## 2022-10-21 NOTE — Telephone Encounter (Signed)
  Chief Complaint: Sore over her tailbone area been there for several years.   Been told it's a hair from her mother at birth. Symptoms: It flares up once or twice a year.    She is ready to have this removed. Frequency: Once to twice a year it flares up Pertinent Negatives: Patient denies drainage or ever having anything done to it. Disposition: [] ED /[] Urgent Care (no appt availability in office) / [x] Appointment(In office/virtual)/ []  [place]/ [] Home Care/ [] Refused Recommended Disposition /[] [facility]/ []  Follow-up with PCP Additional Notes: Appt. Made with Dr. Althea Charon for 10/26/2022 at 1:40.

## 2022-10-21 NOTE — Telephone Encounter (Signed)
Reason for Disposition  Looks like a boil or deep ulcer  Answer Assessment - Initial Assessment Questions 1. APPEARANCE of SORES: "What do the sores look like?"     I have a sore on my tailbone.   It's been there for years.   It's a hair from my mother that needs to be removed.    It's very painful.  This happens every year. 2. NUMBER: "How many sores are there?"     One area 3. SIZE: "How big is the largest sore?"     Quarter sized 4. LOCATION: "Where are the sores located?"     Over my tailbone 5. ONSET: "When did the sores begin?"     A day or two ago.    6. TENDER: "Does it hurt when you touch it?"  (Scale 1-10; or mild, moderate, severe)      It's sore 7. CAUSE: "What do you think is causing the sores?"     There is a hair in there from my mother 8. OTHER SYMPTOMS: "Do you have any other symptoms?" (e.g., fever, new weakness)     No  Protocols used: Sores-A-AH

## 2022-10-24 DIAGNOSIS — L0591 Pilonidal cyst without abscess: Secondary | ICD-10-CM

## 2022-10-24 HISTORY — DX: Pilonidal cyst without abscess: L05.91

## 2022-10-26 ENCOUNTER — Ambulatory Visit: Payer: 59 | Admitting: Family Medicine

## 2022-10-26 VITALS — BP 120/65 | HR 71 | Ht 64.0 in | Wt 153.8 lb

## 2022-10-26 DIAGNOSIS — L0591 Pilonidal cyst without abscess: Secondary | ICD-10-CM

## 2022-10-26 MED ORDER — AMOXICILLIN-POT CLAVULANATE 875-125 MG PO TABS: 1.0000 | ORAL_TABLET | Freq: Two times a day (BID) | ORAL | 0 refills | Status: DC

## 2022-10-26 NOTE — Patient Instructions (Addendum)
Thank you for coming to the office today.  Rx antibiotic only if it flares up swells, pain, redness, drainage. ------  [facility] [address] [place],  [place]  [address] Phone: [phone]  Pilonidal Cyst  A pilonidal cyst is a fluid-filled sac that forms under the skin near the tailbone, at the top of the crease of the buttocks (pilonidal area). Cysts that are small and not infected may not cause any problems. Cysts that become irritated or infected may grow and fill with pus. An infected cyst is called an abscess. A pilonidal abscess may cause pain and swelling. It may need to be drained or removed. What are the causes? The cause of this condition is not always known. In some cases, it may be caused by a hair that grows into your skin (ingrown hair). What increases the risk? You are more likely to develop this condition if: You are female. You have lots of hair near the crease of the buttocks. You are overweight. You have a dimple near the crease of the buttocks. You wear tight clothing. You do not bathe or shower often. You sit for long periods of time. What are the signs or symptoms? Symptoms of this condition may include pain, swelling, redness, and warmth in the pilonidal area. You may also be able to feel a lump near your tailbone if your cyst is big. If your cyst becomes infected, symptoms may include: Pus or fluid drainage. Fever. Pain, swelling, and redness getting worse. The lump getting bigger. How is this diagnosed? This condition may be diagnosed based on: Your symptoms and medical history. A physical exam. A blood test to check for infection. A test of a pus sample. How is this treated? You may not need any treatment if your cyst does not cause symptoms. If your cyst bothers you or is infected, you may need a procedure to drain or remove the cyst. Depending on the size, location, and severity of your cyst, your health care  provider may: Make an incision in the cyst and drain it (incision anddrainage). Open and drain the cyst, and then stitch the wound so that it stays open while it heals (marsupialization). You will be given instructions about how to care for your open wound while it heals. Remove all or part of the cyst, and then close the wound (cyst removal). You may need to take antibiotics before your procedure. Follow these instructions at home: Medicines Take over-the-counter and prescription medicines only as told by your health care provider. If you were prescribed antibiotics, take them as told by your health care provider. Do not stop taking the antibiotic even if you start to feel better. General instructions Keep the area around the cyst clean and dry. If there is fluid or pus draining from your cyst: Cover the area with a clean bandage (dressing). Wash the area gently with soap and water. Pat the area dry with a clean towel. Do not rub the area because that may cause bleeding. Remove hair from the area around the cyst only if your health care provider tells you to. Do not wear tight pants or sit in one position for long periods at a time. Contact a health care provider if: You have new redness, swelling, or pain. You have a fever. You have severe pain. Summary A pilonidal cyst is a fluid-filled sac that forms under the skin near the tailbone, at the top of the crease of the buttocks (pilonidal area). Cysts  that become irritated or infected may grow and fill with pus. An infected cyst is called an abscess. The cause of this condition is not always known. In some cases, it may be caused by a hair that grows into your skin (ingrown hair). You may not need any treatment if your cyst does not cause symptoms. If your cyst bothers you or is infected, you may need a procedure to drain or remove the cyst. This information is not intended to replace advice given to you by your health care provider. Make  sure you discuss any questions you have with your health care provider. Document Revised: 08/06/2021 Document Reviewed: 08/06/2021 Elsevier Patient Education  2024 [employer]   Please schedule a Follow-up Appointment to: No follow-ups on file.  If you have any other questions or concerns, please feel free to call the office or send a message through MyChart. You may also schedule an earlier appointment if necessary.  Additionally, you may be receiving a survey about your experience at our office within a few days to 1 week by e-mail or mail. We value your feedback.  Saralyn Pilar, DO [facility], [place]

## 2022-10-26 NOTE — Progress Notes (Signed)
Subjective:    Patient ID: [name], female    DOB: 1965/09/23, 57 y.o.   MRN: [id number]  [name] is a 57 y.o. female presenting on 10/26/2022 for Cyst (Located at the tailbone, has been an issue since 2016)   HPI  Pilonidal Cyst Reports localized cyst structure of gluteal cleft tailbone, present previously for years, but has not flared up recently. Admits starting to flare again and early swelling discomfort. No drainage or ulceration. Requesting surgical evaluation and excision      09/14/2022    3:37 PM 02/26/2022   10:58 AM 04/21/2021    1:37 PM  Depression screen PHQ 2/9  Decreased Interest 0 0 0  Down, Depressed, Hopeless 0 0 0  PHQ - 2 Score 0 0 0  Altered sleeping  1   Tired, decreased energy  1   Change in appetite  3   Feeling bad or failure about yourself   0   Trouble concentrating  0   Moving slowly or fidgety/restless  0   Suicidal thoughts  0   PHQ-9 Score  5   Difficult doing work/chores  Not difficult at all     Social History   Tobacco Use   Smoking status: Former    Packs/day: 1.00    Years: 30.00    Additional pack years: 0.00    Total pack years: 30.00    Types: Cigarettes, E-cigarettes    Quit date: 06/25/2020    Years since quitting: 2.3   Smokeless tobacco: Never  Vaping Use   Vaping Use: Some days  Substance Use Topics   Alcohol use: Yes   Drug use: No    Review of Systems Per HPI unless specifically indicated above     Objective:    BP 120/65 (BP Location: Right Arm, Patient Position: Sitting, Cuff Size: Large)   Pulse 71   Ht 5\' 4"  (1.626 m)   Wt 153 lb 12.8 oz (69.8 kg)   SpO2 99%   BMI 26.40 kg/m   Wt Readings from Last 3 Encounters:  10/26/22 153 lb 12.8 oz (69.8 kg)  09/14/22 151 lb (68.5 kg)  02/26/22 167 lb (75.8 kg)    Physical Exam Vitals and nursing note reviewed.  Constitutional:      General: She is not in acute distress.    Appearance: Normal appearance. She is well-developed. She is not  diaphoretic.     Comments: Well-appearing, comfortable, cooperative  HENT:     Head: Normocephalic and atraumatic.  Eyes:     General:        Right eye: No discharge.        Left eye: No discharge.     Conjunctiva/sclera: Conjunctivae normal.  Cardiovascular:     Rate and Rhythm: Normal rate.  Pulmonary:     Effort: Pulmonary effort is normal.  Skin:    General: Skin is warm and dry.     Findings: Lesion (localized pilonidal cyst, uninflamed and not infected) present. No erythema or rash.  Neurological:     Mental Status: She is alert and oriented to person, place, and time.  Psychiatric:        Mood and Affect: Mood normal.        Behavior: Behavior normal.        Thought Content: Thought content normal.     Comments: Well groomed, good eye contact, normal speech and thoughts    Results for orders placed or performed in visit on  09/14/22  COMPLETE METABOLIC PANEL WITH GFR  Result Value Ref Range   Glucose, Bld 88 65 - 139 mg/dL   BUN 14 7 - 25 mg/dL   Creat 1.61 0.96 - 0.45 mg/dL   eGFR 85 > OR = 60 WU/[id number]/[id number]   BUN/Creatinine Ratio SEE NOTE: 6 - 22 (calc)   Sodium 142 135 - 146 mmol/L   Potassium 4.4 3.5 - 5.3 mmol/L   Chloride 106 98 - 110 mmol/L   CO2 30 20 - 32 mmol/L   Calcium 9.5 8.6 - 10.4 mg/dL   Total Protein 7.2 6.1 - 8.1 g/dL   Albumin 4.4 3.6 - 5.1 g/dL   Globulin 2.8 1.9 - 3.7 g/dL (calc)   AG Ratio 1.6 1.0 - 2.5 (calc)   Total Bilirubin 0.3 0.2 - 1.2 mg/dL   Alkaline phosphatase (APISO) 81 37 - 153 U/L   AST 13 10 - 35 U/L   ALT 10 6 - 29 U/L  CBC with Differential/Platelet  Result Value Ref Range   WBC 8.4 3.8 - 10.8 Thousand/uL   RBC 4.59 3.80 - 5.10 Million/uL   Hemoglobin 14.5 11.7 - 15.5 g/dL   HCT 82.9 56.2 - 13.0 %   MCV 92.8 80.0 - 100.0 fL   MCH 31.6 27.0 - 33.0 pg   MCHC 34.0 32.0 - 36.0 g/dL   RDW 86.5 78.4 - 69.6 %   Platelets 214 140 - 400 Thousand/uL   MPV 11.7 7.5 - 12.5 fL   Neutro Abs 5,082 1,500 - 7,800 cells/uL   Lymphs  Abs 2,671 850 - 3,900 cells/uL   Absolute Monocytes 554 200 - 950 cells/uL   Eosinophils Absolute 59 15 - 500 cells/uL   Basophils Absolute 34 0 - 200 cells/uL   Neutrophils Relative % 60.5 %   Total Lymphocyte 31.8 %   Monocytes Relative 6.6 %   Eosinophils Relative 0.7 %   Basophils Relative 0.4 %  Hemoglobin A1c  Result Value Ref Range   Hgb A1c MFr Bld 5.6 <5.7 % of total Hgb   Mean Plasma Glucose 114 mg/dL   eAG (mmol/L) 6.3 mmol/L  Lipid panel  Result Value Ref Range   Cholesterol 187 <200 mg/dL   HDL 34 (L) > OR = 50 mg/dL   Triglycerides 295 (H) <150 mg/dL   LDL Cholesterol (Calc)  mg/dL (calc)   Total CHOL/HDL Ratio 5.5 (H) <5.0 (calc)   Non-HDL Cholesterol (Calc) 153 (H) <130 mg/dL (calc)  TSH  Result Value Ref Range   TSH 0.84 0.40 - 4.50 mIU/L      Assessment & Plan:   Problem List Items Addressed This Visit   None Visit Diagnoses     Pilonidal cyst    -  Primary   Relevant Medications   amoxicillin-clavulanate (AUGMENTIN) 875-125 MG tablet   Other Relevant Orders   Ambulatory referral to General Surgery       Clinically with localized pilonidal cyst of gluteal cleft, prior history of flare Currently stable with gradual increase and concern for flaring up No drainage of opening or ulceration at this time  Rx Augmentin course for empiric course  Referral to Gen Surgery for excision  Orders Placed This Encounter  Procedures   Ambulatory referral to General Surgery    Referral Priority:   Routine    Referral Type:   Surgical    Referral Reason:   Specialty Services Required    Requested Specialty:   General Surgery    Number of Visits  Requested:   1     Meds ordered this encounter  Medications   amoxicillin-clavulanate (AUGMENTIN) 875-125 MG tablet    Sig: Take 1 tablet by mouth 2 (two) times daily. If pilonidal cyst flares up.    Dispense:  20 tablet    Refill:  0    Follow up plan: Return if symptoms worsen or fail to  improve.   Saralyn Pilar, DO [facility] 10/26/2022, 1:57 PM

## 2022-10-27 ENCOUNTER — Ambulatory Visit: Payer: 59 | Admitting: Surgery

## 2022-10-27 ENCOUNTER — Ambulatory Visit: Payer: Self-pay | Admitting: Surgery

## 2022-10-27 ENCOUNTER — Telehealth: Payer: Self-pay | Admitting: Surgery

## 2022-10-27 ENCOUNTER — Encounter: Payer: Self-pay | Admitting: Surgery

## 2022-10-27 VITALS — BP 106/67 | HR 69 | Temp 98.1°F | Ht 64.0 in | Wt 151.8 lb

## 2022-10-27 DIAGNOSIS — L0591 Pilonidal cyst without abscess: Secondary | ICD-10-CM

## 2022-10-27 NOTE — Progress Notes (Addendum)
Patient ID: [name], female   DOB: 03-Aug-1965, 57 y.o.   MRN: [id number]  Chief Complaint: Pilonidal cyst for 15 years.  History of Present Illness [name] is a 57 y.o. female with a prior pilonidal abscess drained in 2016.  Since that time she has had some persistent intermittent discomfort along with spontaneous drainage.  Denies any prior excision, has never been treated with antibiotics for the same.  Past Medical History Past Medical History:  Diagnosis Date   High cholesterol       Past Surgical History:  Procedure Laterality Date   ABDOMINAL HYSTERECTOMY     BREAST BIOPSY Right 01-14-12   core/clip neg   COLONOSCOPY WITH PROPOFOL N/A 05/27/2021   Procedure: COLONOSCOPY WITH PROPOFOL;  Surgeon: Toney Reil, MD;  Location: [facility] ENDOSCOPY;  Service: Gastroenterology;  Laterality: N/A;    Allergies  Allergen Reactions   Cetirizine     Other Reaction(s): Not available    Current Outpatient Medications  Medication Sig Dispense Refill   aspirin 81 MG tablet Take 81 mg by mouth daily.     loratadine (CLARITIN) 10 MG tablet Take 10 mg by mouth daily.     nitroGLYCERIN (NITROSTAT) 0.4 MG SL tablet Place 1 tablet (0.4 mg total) under the tongue every 5 (five) minutes as needed for chest pain. 30 tablet 2   pantoprazole (PROTONIX) 40 MG tablet Take 1 tablet (40 mg total) by mouth daily before breakfast. 90 tablet 1   sucralfate (CARAFATE) 1 g tablet TAKE 1 TABLET BY MOUTH 4 TIMES DAILY WITH MEALS AND AT BEDTIME AS NEEDED FOR ABDOMINAL PAIN 30 tablet 0   amoxicillin-clavulanate (AUGMENTIN) 875-125 MG tablet Take 1 tablet by mouth 2 (two) times daily. If pilonidal cyst flares up. (Patient not taking: Reported on 10/27/2022) 20 tablet 0   No current facility-administered medications for this visit.    Family History Family History  Problem Relation Age of Onset   Diabetes Mother    Breast cancer Sister 56   Diabetes Daughter    Breast cancer Paternal Aunt     Breast cancer Paternal Aunt    Hearing loss Neg Hx    Hypertension Neg Hx    Hyperlipidemia Neg Hx       Social History Social History   Tobacco Use   Smoking status: Former    Packs/day: 1.00    Years: 30.00    Additional pack years: 0.00    Total pack years: 30.00    Types: Cigarettes, E-cigarettes    Quit date: 06/25/2020    Years since quitting: 2.3   Smokeless tobacco: Never  Vaping Use   Vaping Use: Some days  Substance Use Topics   Alcohol use: Yes   Drug use: No        Review of Systems  Constitutional: Negative.   HENT: Negative.    Eyes: Negative.   Respiratory: Negative.    Cardiovascular: Negative.   Gastrointestinal: Negative.   Genitourinary: Negative.   Skin: Negative.   Neurological: Negative.   Psychiatric/Behavioral: Negative.       Physical Exam Blood pressure 106/67, pulse 69, temperature 98.1 F (36.7 C), temperature source Oral, height 5\' 4"  (1.626 m), weight 151 lb 12.8 oz (68.9 kg), SpO2 98 %. Last Weight  Most recent update: 10/27/2022 10:10 AM    Weight  68.9 kg (151 lb 12.8 oz)             CONSTITUTIONAL: Well developed, and nourished, appropriately responsive and  aware without distress.   EYES: Sclera non-icteric.   EARS, NOSE, MOUTH AND THROAT:  The oropharynx is clear. Oral mucosa is pink and moist.    Hearing is intact to voice.  NECK: Trachea is midline, and there is no jugular venous distension.  LYMPH NODES:  Lymph nodes in the neck are not appreciated. RESPIRATORY:  Lungs are clear, and breath sounds are equal bilaterally.  Normal respiratory effort without pathologic use of accessory muscles. CARDIOVASCULAR: Heart is regular in rate and rhythm.   Well perfused.  GI: The abdomen is  soft, nontender, and nondistended. There were no palpable masses.  GU:  Marylene Land present as chaperone: There is an isolated pilonidal pit in the sacral region, adjacent to that on the left there is a firm residual cyst present.  Approximately 2  cm in size, no fluctuance, induration or erythema present.  There is no dermal hair in the vicinity.  Minimally tender.  MUSCULOSKELETAL:  Symmetrical muscle tone appreciated in all four extremities.    SKIN: Skin turgor is normal. No pathologic skin lesions appreciated.  NEUROLOGIC:  Motor and sensation appear grossly normal.  Cranial nerves are grossly without defect. PSYCH:  Alert and oriented to person, place and time. Affect is appropriate for situation.  Data Reviewed I have personally reviewed what is currently available of the patient's imaging, recent labs and medical records.   Labs:     Latest Ref Rng & Units 09/14/2022    4:18 PM 02/26/2022   11:40 AM 04/21/2021    1:49 PM  CBC  WBC 3.8 - 10.8 Thousand/uL 8.4  7.2  6.5   Hemoglobin 11.7 - 15.5 g/dL 84.6  96.2  95.2   Hematocrit 35.0 - 45.0 % 42.6  41.2  40.0   Platelets 140 - 400 Thousand/uL 214  194  185       Latest Ref Rng & Units 09/14/2022    4:18 PM 02/26/2022   11:40 AM 04/21/2021    1:49 PM  CMP  Glucose 65 - 139 mg/dL 88  92  841   BUN 7 - 25 mg/dL 14  23  16    Creatinine 0.50 - 1.03 mg/dL 3.24  4.01  0.27   Sodium 135 - 146 mmol/L 142  141  142   Potassium 3.5 - 5.3 mmol/L 4.4  4.2  3.9   Chloride 98 - 110 mmol/L 106  105  109   CO2 20 - 32 mmol/L 30  30  25    Calcium 8.6 - 10.4 mg/dL 9.5  9.7  9.2   Total Protein 6.1 - 8.1 g/dL 7.2  7.1  6.4   Total Bilirubin 0.2 - 1.2 mg/dL 0.3  0.5  0.4   AST 10 - 35 U/L 13  16  23    ALT 6 - 29 U/L 10  18  26        Imaging: Radiological images reviewed:   Within last 24 hrs: No results found.  Assessment    Pilonidal cyst.  Patient Active Problem List   Diagnosis Date Noted   Positive colorectal cancer screening using Cologuard test    Adenomatous polyp of ascending colon    Elevated hemoglobin A1c 08/03/2017   Chronic right-sided low back pain with right-sided sciatica 07/26/2017   DDD (degenerative disc disease), lumbar 07/26/2017   Lipoma of chest wall  05/27/2017   Hyperlipidemia 04/09/2016   Adjustment disorder 04/09/2016   Nicotine dependence 04/09/2016   Colon cancer screening 04/09/2016   Lump or  mass in breast 03/08/2013    Plan    Excision of pilonidal cyst, extensive.  We discussed various options of treating pilonidal disease, I advised that we typically do not attempt primary closure of these lesions, due to the high risk of recurrence and challenges with wound healing, with eventual opening of the wound.  Believe she understands that most of her process will be on her postoperative wound care.  And she desires to proceed.  Risks of anesthesia, bleeding, infection, recurrence discussed in detail.  Please she understands and desires to proceed.  Questions answered, no further guarantees ever expressed or implied.  Face-to-face time spent with the patient and accompanying care providers(if present) was 30 minutes, with more than 50% of the time spent counseling, educating, and coordinating care of the patient.    These notes generated with voice recognition software. I apologize for typographical errors.  Campbell Lerner M.D., FACS 10/27/2022, 10:40 AM

## 2022-10-27 NOTE — H&P (View-Only) (Signed)
Patient ID: [name], female   DOB: 12/22/1965, 56 y.o.   MRN: [id number]  Chief Complaint: Pilonidal cyst for 15 years.  History of Present Illness [name] is a 56 y.o. female with a prior pilonidal abscess drained in 2016.  Since that time she has had some persistent intermittent discomfort along with spontaneous drainage.  Denies any prior excision, has never been treated with antibiotics for the same.  Past Medical History Past Medical History:  Diagnosis Date   High cholesterol       Past Surgical History:  Procedure Laterality Date   ABDOMINAL HYSTERECTOMY     BREAST BIOPSY Right 01-14-12   core/clip neg   COLONOSCOPY WITH PROPOFOL N/A 05/27/2021   Procedure: COLONOSCOPY WITH PROPOFOL;  Surgeon: Vanga, Rohini Reddy, MD;  Location: [facility] ENDOSCOPY;  Service: Gastroenterology;  Laterality: N/A;    Allergies  Allergen Reactions   Cetirizine     Other Reaction(s): Not available    Current Outpatient Medications  Medication Sig Dispense Refill   aspirin 81 MG tablet Take 81 mg by mouth daily.     loratadine (CLARITIN) 10 MG tablet Take 10 mg by mouth daily.     nitroGLYCERIN (NITROSTAT) 0.4 MG SL tablet Place 1 tablet (0.4 mg total) under the tongue every 5 (five) minutes as needed for chest pain. 30 tablet 2   pantoprazole (PROTONIX) 40 MG tablet Take 1 tablet (40 mg total) by mouth daily before breakfast. 90 tablet 1   sucralfate (CARAFATE) 1 g tablet TAKE 1 TABLET BY MOUTH 4 TIMES DAILY WITH MEALS AND AT BEDTIME AS NEEDED FOR ABDOMINAL PAIN 30 tablet 0   amoxicillin-clavulanate (AUGMENTIN) 875-125 MG tablet Take 1 tablet by mouth 2 (two) times daily. If pilonidal cyst flares up. (Patient not taking: Reported on 10/27/2022) 20 tablet 0   No current facility-administered medications for this visit.    Family History Family History  Problem Relation Age of Onset   Diabetes Mother    Breast cancer Sister 50   Diabetes Daughter    Breast cancer Paternal Aunt     Breast cancer Paternal Aunt    Hearing loss Neg Hx    Hypertension Neg Hx    Hyperlipidemia Neg Hx       Social History Social History   Tobacco Use   Smoking status: Former    Packs/day: 1.00    Years: 30.00    Additional pack years: 0.00    Total pack years: 30.00    Types: Cigarettes, E-cigarettes    Quit date: 06/25/2020    Years since quitting: 2.3   Smokeless tobacco: Never  Vaping Use   Vaping Use: Some days  Substance Use Topics   Alcohol use: Yes   Drug use: No        Review of Systems  Constitutional: Negative.   HENT: Negative.    Eyes: Negative.   Respiratory: Negative.    Cardiovascular: Negative.   Gastrointestinal: Negative.   Genitourinary: Negative.   Skin: Negative.   Neurological: Negative.   Psychiatric/Behavioral: Negative.       Physical Exam Blood pressure 106/67, pulse 69, temperature 98.1 F (36.7 C), temperature source Oral, height 5' 4" (1.626 m), weight 151 lb 12.8 oz (68.9 kg), SpO2 98 %. Last Weight  Most recent update: 10/27/2022 10:10 AM    Weight  68.9 kg (151 lb 12.8 oz)             CONSTITUTIONAL: Well developed, and nourished, appropriately responsive and   aware without distress.   EYES: Sclera non-icteric.   EARS, NOSE, MOUTH AND THROAT:  The oropharynx is clear. Oral mucosa is pink and moist.    Hearing is intact to voice.  NECK: Trachea is midline, and there is no jugular venous distension.  LYMPH NODES:  Lymph nodes in the neck are not appreciated. RESPIRATORY:  Lungs are clear, and breath sounds are equal bilaterally.  Normal respiratory effort without pathologic use of accessory muscles. CARDIOVASCULAR: Heart is regular in rate and rhythm.   Well perfused.  GI: The abdomen is  soft, nontender, and nondistended. There were no palpable masses.  GU:  Angela present as chaperone: There is an isolated pilonidal pit in the sacral region, adjacent to that on the left there is a firm residual cyst present.  Approximately 2  cm in size, no fluctuance, induration or erythema present.  There is no dermal hair in the vicinity.  Minimally tender.  MUSCULOSKELETAL:  Symmetrical muscle tone appreciated in all four extremities.    SKIN: Skin turgor is normal. No pathologic skin lesions appreciated.  NEUROLOGIC:  Motor and sensation appear grossly normal.  Cranial nerves are grossly without defect. PSYCH:  Alert and oriented to person, place and time. Affect is appropriate for situation.  Data Reviewed I have personally reviewed what is currently available of the patient's imaging, recent labs and medical records.   Labs:     Latest Ref Rng & Units 09/14/2022    4:18 PM 02/26/2022   11:40 AM 04/21/2021    1:49 PM  CBC  WBC 3.8 - 10.8 Thousand/uL 8.4  7.2  6.5   Hemoglobin 11.7 - 15.5 g/dL 14.5  14.2  13.4   Hematocrit 35.0 - 45.0 % 42.6  41.2  40.0   Platelets 140 - 400 Thousand/uL 214  194  185       Latest Ref Rng & Units 09/14/2022    4:18 PM 02/26/2022   11:40 AM 04/21/2021    1:49 PM  CMP  Glucose 65 - 139 mg/dL 88  92  102   BUN 7 - 25 mg/dL 14  23  16   Creatinine 0.50 - 1.03 mg/dL 0.81  0.74  0.68   Sodium 135 - 146 mmol/L 142  141  142   Potassium 3.5 - 5.3 mmol/L 4.4  4.2  3.9   Chloride 98 - 110 mmol/L 106  105  109   CO2 20 - 32 mmol/L 30  30  25   Calcium 8.6 - 10.4 mg/dL 9.5  9.7  9.2   Total Protein 6.1 - 8.1 g/dL 7.2  7.1  6.4   Total Bilirubin 0.2 - 1.2 mg/dL 0.3  0.5  0.4   AST 10 - 35 U/L 13  16  23   ALT 6 - 29 U/L 10  18  26       Imaging: Radiological images reviewed:   Within last 24 hrs: No results found.  Assessment    Pilonidal cyst.  Patient Active Problem List   Diagnosis Date Noted   Positive colorectal cancer screening using Cologuard test    Adenomatous polyp of ascending colon    Elevated hemoglobin A1c 08/03/2017   Chronic right-sided low back pain with right-sided sciatica 07/26/2017   DDD (degenerative disc disease), lumbar 07/26/2017   Lipoma of chest wall  05/27/2017   Hyperlipidemia 04/09/2016   Adjustment disorder 04/09/2016   Nicotine dependence 04/09/2016   Colon cancer screening 04/09/2016   Lump or   mass in breast 03/08/2013    Plan    Excision of pilonidal cyst, extensive.  We discussed various options of treating pilonidal disease, I advised that we typically do not attempt primary closure of these lesions, due to the high risk of recurrence and challenges with wound healing, with eventual opening of the wound.  Believe she understands that most of her process will be on her postoperative wound care.  And she desires to proceed.  Risks of anesthesia, bleeding, infection, recurrence discussed in detail.  Please she understands and desires to proceed.  Questions answered, no further guarantees ever expressed or implied.  Face-to-face time spent with the patient and accompanying care providers(if present) was 30 minutes, with more than 50% of the time spent counseling, educating, and coordinating care of the patient.    These notes generated with voice recognition software. I apologize for typographical errors.  Jemia Fata M.D., FACS 10/27/2022, 10:40 AM     

## 2022-10-27 NOTE — Patient Instructions (Signed)
Our surgery scheduler Britta Mccreedy will call you within 24-48 hours to get you scheduled. If you have not heard from her after 48 hours, please call our office. Have the blue sheet available when she calls to write down important information.  If you have any concerns or questions, please feel free to call our office.   Pilonidal Cyst Removal Pilonidal cyst removal is a procedure to remove a fluid-filled sac (cyst) that forms under the skin near the tailbone, at the top of the crease between the buttocks (pilonidal area). This procedure is also called a pilonidal cystectomy.  Pilonidal cyst is caused by an ingrown hair that irritates the area. Sometimes a tunnel (sinus) forms under the skin from the cyst and makes a second opening in the skin. In that case, the sinus area may also be removed during the procedure. You may need this procedure if you have a cyst that is large, painful, or keeps getting infected. A cyst that becomes infected is called an abscess. The abscess may need to be opened, drained, and treated with antibiotics before the cyst is removed. Tell your health care provider about: Any allergies you have. All medicines you are taking, including vitamins, herbs, eye drops, creams, and over-the-counter medicines. Any problems you or family members have had with anesthetic medicines. Any bleeding problems you have. Any surgeries you have had. Any medical conditions you have. Whether you are pregnant or may be pregnant. Any recent fever, increase in pain, or discharge from the cyst. What are the risks? Your health care provider will talk with you about risks. These may include: Delay in healing. This is the most common problem. Infection. Bleeding. Allergic reactions to medicines. A closed incision opening. The cyst coming back again (recurrence). What happens before the procedure? When to stop eating and drinking Follow instructions from your health care provider about what you may  eat and drink. These may include: 8 hours before your procedure Stop eating most foods. Do not eat meat, fried foods, or fatty foods. Eat only light foods, such as toast or crackers. All liquids are okay except energy drinks and alcohol. 6 hours before your procedure Stop eating. Drink only clear liquids, such as water, clear fruit juice, black coffee, plain tea, and sports drinks. Do not drink energy drinks or alcohol. 2 hours before your procedure Stop drinking all liquids. You may be allowed to take medicines with small sips of water. If you do not follow your health care provider's instructions, your procedure may be delayed or canceled. Medicines Ask your health care provider about: Changing or stopping your regular medicines. These include any diabetes medicines or blood thinners you take. Taking medicines such as aspirin and ibuprofen. These medicines can thin your blood. Do not take them unless your health care provider tells you to. Taking over-the-counter medicines, vitamins, herbs, and supplements. Surgery safety Ask your health care provider: How your surgery site will be marked. What steps will be taken to help prevent infection. These steps may include: Removing hair at the surgery site. Washing skin with a soap that kills germs. Taking antibiotics. General instructions Do not use any products that contain nicotine or tobacco for at least 4 weeks before the procedure. These products include cigarettes, chewing tobacco, and vaping devices, such as e-cigarettes. If you need help quitting, ask your health care provider. If you will be going home right after the procedure, plan to have a responsible adult: Take you home from the hospital or clinic. You will  not be allowed to drive. Care for you for the time you are told. You may need help with wound care and dressing changes. What happens during the procedure?  An IV will be inserted into one of your veins. You may be  given: A sedative. This helps you relax. Anesthesia. This will: Numb certain areas of your body. Make you fall asleep for surgery. Your surgeon will make an incision near the cyst. Depending on the size of the cyst and if the sinus is infected, one of the following will be done: If there is an abscess, a small hole will be made in the cyst. The pus will be drained out. If the sinus is large or keeps getting infected, your surgeon may: Cut out the sinus and remove some of the skin around it. The wound will be left open to heal on its own. Remove the sinus and cut out a flap on either side of it. The two sides will be stitched together. A thin, flexible tube with a camera (endoscope) may be used before this procedure to better see the area. The surgeon may remove hair and infected tissue. The sinus will then be cleaned with a solution. Heat will be used to seal the sinus. The incision may be left open or closed. An open incision may be packed with gauze and covered with a bandage (dressing). An incision may be closed with stitches (sutures) and covered with a dressing. The area may be sealed with fibrin glue and covered with a dressing. The procedure may vary among health care providers and hospitals. What happens after the procedure? Your blood pressure, heart rate, breathing rate, and blood oxygen level will be monitored until you leave the hospital or clinic. You will be given medicine for pain as needed. If you were given a sedative during the procedure, it can affect you for several hours. Do not drive or operate machinery until your health care provider says that it is safe. Your health care provider will give you instructions for taking care of your dressing at home after the procedure. If your incision was left open and packed with gauze, you will need to change your dressing every day. Summary Pilonidal cyst removal is surgery to remove a fluid-filled sac (cyst) that forms in the crease  between the buttocks. The incision used to remove the cyst may be closed with sutures or left open. If left open, it may be packed with gauze and covered with a dressing. You will be given medicine for pain as needed. Your health care provider will give you instructions for taking care of your dressing at home. This information is not intended to replace advice given to you by your health care provider. Make sure you discuss any questions you have with your health care provider. Document Revised: 08/15/2021 Document Reviewed: 08/15/2021 Elsevier Patient Education  2024 [employer].

## 2022-10-27 NOTE — Telephone Encounter (Signed)
Patient has been advised of Pre-Admission date/time, and Surgery date at [facility].  Surgery Date: 11/09/22 Preadmission Testing Date: 10/30/22 (phone 1p-4p)  Patient has been made aware to call [phone], between 1-3:00pm the day before surgery, to find out what time to arrive for surgery.    

## 2022-10-30 ENCOUNTER — Encounter
Admission: RE | Admit: 2022-10-30 | Discharge: 2022-10-30 | Disposition: A | Discharge status: Home / Self Care | Payer: 59 | Source: Ambulatory Visit | Attending: Surgery | Admitting: Surgery

## 2022-10-30 VITALS — Ht 64.0 in | Wt 151.0 lb

## 2022-10-30 DIAGNOSIS — E7800 Pure hypercholesterolemia, unspecified: Secondary | ICD-10-CM

## 2022-10-30 DIAGNOSIS — R0789 Other chest pain: Secondary | ICD-10-CM

## 2022-10-30 DIAGNOSIS — Z01812 Encounter for preprocedural laboratory examination: Secondary | ICD-10-CM

## 2022-10-30 HISTORY — DX: Benign neoplasm of ascending colon: D12.2

## 2022-10-30 HISTORY — DX: Other intervertebral disc degeneration, lumbar region: M51.36

## 2022-10-30 HISTORY — DX: Other chest pain: R07.89

## 2022-10-30 HISTORY — DX: Other intervertebral disc degeneration, lumbar region without mention of lumbar back pain or lower extremity pain: M51.369

## 2022-10-30 HISTORY — DX: Hyperlipidemia, unspecified: E78.5

## 2022-10-30 NOTE — Patient Instructions (Addendum)
Your procedure is scheduled on: Monday, June 17 Report to the Registration Desk on the 1st floor of the [facility] To find out your arrival time, please call [phone] between 1PM - 3PM on: Friday, June 14 If your arrival time is 6:00 am, do not arrive before that time as the [facility] entrance doors do not open until 6:00 am.  REMEMBER: Instructions that are not followed completely may result in serious medical risk, up to and including death; or upon the discretion of your surgeon and anesthesiologist your surgery may need to be rescheduled.  Do not eat or drink after midnight the night before surgery.  No gum chewing or hard candies.  One week prior to surgery: starting June 10 Stop aspirin and Anti-inflammatories (NSAIDS) such as Advil, Aleve, Ibuprofen, Motrin, Naproxen, Naprosyn and Aspirin based products such as Excedrin, Goody's Powder, BC Powder. Stop ANY OVER THE COUNTER supplements until after surgery. You may however, continue to take Tylenol if needed for pain up until the day of surgery.  Continue taking all prescribed medications   TAKE ONLY THESE MEDICATIONS THE MORNING OF SURGERY WITH A SIP OF WATER:  Pantoprazole (Protonix) - (take one the night before and one on the morning of surgery - helps to prevent nausea after surgery.)  No Alcohol for 24 hours before or after surgery.  No Smoking including e-cigarettes for 24 hours before surgery.  No chewable tobacco products for at least 6 hours before surgery.  No nicotine patches on the day of surgery.  Do not use any "recreational" drugs for at least a week (preferably 2 weeks) before your surgery.  Please be advised that the combination of cocaine and anesthesia may have negative outcomes, up to and including death. If you test positive for cocaine, your surgery will be cancelled.  On the morning of surgery brush your teeth with toothpaste and water, you may rinse your mouth with mouthwash if you wish. Do  not swallow any toothpaste or mouthwash.  Use CHG Soap as directed on instruction sheet.  Do not wear jewelry, make-up, hairpins, clips or nail polish.  Do not wear lotions, powders, or perfumes.   Do not shave body hair from the neck down 48 hours before surgery.  Contact lenses, hearing aids and dentures may not be worn into surgery.  Do not bring valuables to the hospital. [facility] is not responsible for any missing/lost belongings or valuables.   Notify your doctor if there is any change in your medical condition (cold, fever, infection).  Wear comfortable clothing (specific to your surgery type) to the hospital.  After surgery, you can help prevent lung complications by doing breathing exercises.  Take deep breaths and cough every 1-2 hours. Your doctor may order a device called an Incentive Spirometer to help you take deep breaths.  If you are being discharged the day of surgery, you will not be allowed to drive home. You will need a responsible individual to drive you home and stay with you for 24 hours after surgery.   If you are taking public transportation, you will need to have a responsible individual with you.  Please call the Pre-admissions Testing Dept. at [phone] if you have any questions about these instructions.  Surgery Visitation Policy:  Patients having surgery or a procedure may have two visitors.  Children under the age of 28 must have an adult with them who is not the patient.     Preparing for Surgery with CHLORHEXIDINE GLUCONATE (  CHG) Soap  Chlorhexidine Gluconate (CHG) Soap  o An antiseptic cleaner that kills germs and bonds with the skin to continue killing germs even after washing  o Used for showering the night before surgery and morning of surgery  Before surgery, you can play an important role by reducing the number of germs on your skin.  CHG (Chlorhexidine gluconate) soap is an antiseptic cleanser which kills germs and bonds  with the skin to continue killing germs even after washing.  Please do not use if you have an allergy to CHG or antibacterial soaps. If your skin becomes reddened/irritated stop using the CHG.  1. Shower the NIGHT BEFORE SURGERY and the MORNING OF SURGERY with CHG soap.  2. If you choose to wash your hair, wash your hair first as usual with your normal shampoo.  3. After shampooing, rinse your hair and body thoroughly to remove the shampoo.  4. Use CHG as you would any other liquid soap. You can apply CHG directly to the skin and wash gently with a scrungie or a clean washcloth.  5. Apply the CHG soap to your body only from the neck down. Do not use on open wounds or open sores. Avoid contact with your eyes, ears, mouth, and genitals (private parts). Wash face and genitals (private parts) with your normal soap.  6. Wash thoroughly, paying special attention to the area where your surgery will be performed.  7. Thoroughly rinse your body with warm water.  8. Do not shower/wash with your normal soap after using and rinsing off the CHG soap.  9. Pat yourself dry with a clean towel.  10. Wear clean pajamas to bed the night before surgery.  12. Place clean sheets on your bed the night of your first shower and do not sleep with pets.  13. Shower again with the CHG soap on the day of surgery prior to arriving at the hospital.  14. Do not apply any deodorants/lotions/powders.  15. Please wear clean clothes to the hospital.

## 2022-11-02 ENCOUNTER — Encounter
Admission: RE | Admit: 2022-11-02 | Discharge: 2022-11-02 | Disposition: A | Discharge status: Home / Self Care | Payer: 59 | Source: Ambulatory Visit | Attending: Surgery | Admitting: Surgery

## 2022-11-02 DIAGNOSIS — E7800 Pure hypercholesterolemia, unspecified: Secondary | ICD-10-CM | POA: Diagnosis not present

## 2022-11-02 DIAGNOSIS — Z01810 Encounter for preprocedural cardiovascular examination: Secondary | ICD-10-CM | POA: Diagnosis not present

## 2022-11-02 DIAGNOSIS — R001 Bradycardia, unspecified: Secondary | ICD-10-CM | POA: Insufficient documentation

## 2022-11-02 DIAGNOSIS — R0789 Other chest pain: Secondary | ICD-10-CM | POA: Insufficient documentation

## 2022-11-02 DIAGNOSIS — Z01812 Encounter for preprocedural laboratory examination: Secondary | ICD-10-CM

## 2022-11-08 MED ORDER — LACTATED RINGERS IV SOLN: INTRAVENOUS | Status: DC

## 2022-11-08 MED ORDER — CEFAZOLIN SODIUM-DEXTROSE 2-4 GM/100ML-% IV SOLN
2.0000 g | INTRAVENOUS | Status: AC
  Administered 2022-11-09: 2 g via INTRAVENOUS

## 2022-11-08 MED ORDER — CELECOXIB 200 MG PO CAPS
200.0000 mg | ORAL_CAPSULE | ORAL | Status: AC
  Administered 2022-11-09: 200 mg via ORAL

## 2022-11-08 MED ORDER — ACETAMINOPHEN 500 MG PO TABS
1000.0000 mg | ORAL_TABLET | ORAL | Status: AC
  Administered 2022-11-09: 1000 mg via ORAL

## 2022-11-08 MED ORDER — BUPIVACAINE LIPOSOME 1.3 % IJ SUSP: 20.0000 mL | Freq: Once | INTRAMUSCULAR | Status: DC

## 2022-11-08 MED ORDER — ORAL CARE MOUTH RINSE: 15.0000 mL | Freq: Once | OROMUCOSAL | Status: AC

## 2022-11-08 MED ORDER — CHLORHEXIDINE GLUCONATE 0.12 % MT SOLN
15.0000 mL | Freq: Once | OROMUCOSAL | Status: AC
  Administered 2022-11-09: 15 mL via OROMUCOSAL

## 2022-11-08 MED ORDER — GABAPENTIN 300 MG PO CAPS
300.0000 mg | ORAL_CAPSULE | ORAL | Status: AC
  Administered 2022-11-09: 300 mg via ORAL

## 2022-11-08 MED ORDER — CHLORHEXIDINE GLUCONATE CLOTH 2 % EX PADS: 6.0000 | MEDICATED_PAD | Freq: Once | CUTANEOUS | Status: DC

## 2022-11-09 ENCOUNTER — Ambulatory Visit: Payer: 59 | Admitting: Certified Registered"

## 2022-11-09 ENCOUNTER — Other Ambulatory Visit: Payer: Self-pay

## 2022-11-09 ENCOUNTER — Ambulatory Visit
Admission: RE | Admit: 2022-11-09 | Discharge: 2022-11-09 | Disposition: A | Discharge status: Home / Self Care | Payer: 59 | Attending: Surgery | Admitting: Surgery

## 2022-11-09 ENCOUNTER — Encounter
Admission: RE | Disposition: A | Discharge status: Home / Self Care | Payer: Self-pay | Source: Home / Self Care | Attending: Surgery

## 2022-11-09 ENCOUNTER — Encounter: Payer: Self-pay | Admitting: Surgery

## 2022-11-09 DIAGNOSIS — G709 Myoneural disorder, unspecified: Secondary | ICD-10-CM | POA: Diagnosis not present

## 2022-11-09 DIAGNOSIS — L0591 Pilonidal cyst without abscess: Secondary | ICD-10-CM | POA: Diagnosis present

## 2022-11-09 HISTORY — PX: PILONIDAL CYST EXCISION: SHX744

## 2022-11-09 SURGERY — EXCISION, PILONIDAL CYST, EXTENSIVE
Anesthesia: General | Site: Coccyx

## 2022-11-09 MED ORDER — CHLORHEXIDINE GLUCONATE 0.12 % MT SOLN
OROMUCOSAL | Status: AC
  Filled 2022-11-09: qty 15

## 2022-11-09 MED ORDER — METHYLENE BLUE 1 % INJ SOLN
INTRAVENOUS | Status: DC | PRN
  Administered 2022-11-09: 1 mL via INTRADERMAL

## 2022-11-09 MED ORDER — BUPIVACAINE-EPINEPHRINE 0.25% -1:200000 IJ SOLN
INTRAMUSCULAR | Status: DC | PRN
  Administered 2022-11-09: 18 mL

## 2022-11-09 MED ORDER — HYDROGEN PEROXIDE 3 % EX SOLN
CUTANEOUS | Status: DC | PRN
  Administered 2022-11-09: 1

## 2022-11-09 MED ORDER — CELECOXIB 200 MG PO CAPS
ORAL_CAPSULE | ORAL | Status: AC
  Filled 2022-11-09: qty 1

## 2022-11-09 MED ORDER — ONDANSETRON HCL 4 MG/2ML IJ SOLN
INTRAMUSCULAR | Status: AC
  Filled 2022-11-09: qty 2

## 2022-11-09 MED ORDER — OXYCODONE-ACETAMINOPHEN 5-325 MG PO TABS: 1.0000 | ORAL_TABLET | ORAL | 0 refills | Status: DC | PRN

## 2022-11-09 MED ORDER — ACETAMINOPHEN 10 MG/ML IV SOLN: 1000.0000 mg | Freq: Once | INTRAVENOUS | Status: DC | PRN

## 2022-11-09 MED ORDER — BUPIVACAINE HCL (PF) 0.25 % IJ SOLN
INTRAMUSCULAR | Status: AC
  Filled 2022-11-09: qty 30

## 2022-11-09 MED ORDER — PROPOFOL 10 MG/ML IV BOLUS
INTRAVENOUS | Status: DC | PRN
  Administered 2022-11-09: 100 mg via INTRAVENOUS

## 2022-11-09 MED ORDER — PROPOFOL 10 MG/ML IV BOLUS
INTRAVENOUS | Status: AC
  Filled 2022-11-09: qty 20

## 2022-11-09 MED ORDER — EPHEDRINE 5 MG/ML INJ
INTRAVENOUS | Status: AC
  Filled 2022-11-09: qty 5

## 2022-11-09 MED ORDER — SUCCINYLCHOLINE CHLORIDE 200 MG/10ML IV SOSY
PREFILLED_SYRINGE | INTRAVENOUS | Status: DC | PRN
  Administered 2022-11-09: 120 mg via INTRAVENOUS

## 2022-11-09 MED ORDER — LIDOCAINE HCL (CARDIAC) PF 100 MG/5ML IV SOSY
PREFILLED_SYRINGE | INTRAVENOUS | Status: DC | PRN
  Administered 2022-11-09: 100 mg via INTRAVENOUS

## 2022-11-09 MED ORDER — EPHEDRINE SULFATE (PRESSORS) 50 MG/ML IJ SOLN
INTRAMUSCULAR | Status: DC | PRN
  Administered 2022-11-09: 10 mg via INTRAVENOUS

## 2022-11-09 MED ORDER — DROPERIDOL 2.5 MG/ML IJ SOLN: 0.6250 mg | Freq: Once | INTRAMUSCULAR | Status: DC | PRN

## 2022-11-09 MED ORDER — FENTANYL CITRATE (PF) 100 MCG/2ML IJ SOLN
INTRAMUSCULAR | Status: DC | PRN
  Administered 2022-11-09: 25 ug via INTRAVENOUS

## 2022-11-09 MED ORDER — MIDAZOLAM HCL 2 MG/2ML IJ SOLN
INTRAMUSCULAR | Status: DC | PRN
  Administered 2022-11-09: 2 mg via INTRAVENOUS

## 2022-11-09 MED ORDER — MIDAZOLAM HCL 2 MG/2ML IJ SOLN
INTRAMUSCULAR | Status: AC
  Filled 2022-11-09: qty 2

## 2022-11-09 MED ORDER — GABAPENTIN 300 MG PO CAPS
ORAL_CAPSULE | ORAL | Status: AC
  Filled 2022-11-09: qty 1

## 2022-11-09 MED ORDER — METHYLENE BLUE 1 % INJ SOLN
INTRAVENOUS | Status: AC
  Filled 2022-11-09: qty 10

## 2022-11-09 MED ORDER — PHENYLEPHRINE 80 MCG/ML (10ML) SYRINGE FOR IV PUSH (FOR BLOOD PRESSURE SUPPORT)
PREFILLED_SYRINGE | INTRAVENOUS | Status: AC
  Filled 2022-11-09: qty 10

## 2022-11-09 MED ORDER — ROCURONIUM BROMIDE 10 MG/ML (PF) SYRINGE
PREFILLED_SYRINGE | INTRAVENOUS | Status: AC
  Filled 2022-11-09: qty 10

## 2022-11-09 MED ORDER — DEXAMETHASONE SODIUM PHOSPHATE 10 MG/ML IJ SOLN
INTRAMUSCULAR | Status: DC | PRN
  Administered 2022-11-09: 10 mg via INTRAVENOUS

## 2022-11-09 MED ORDER — ACETAMINOPHEN 500 MG PO TABS
ORAL_TABLET | ORAL | Status: AC
  Filled 2022-11-09: qty 2

## 2022-11-09 MED ORDER — OXYCODONE HCL 5 MG PO TABS: 5.0000 mg | ORAL_TABLET | Freq: Once | ORAL | Status: DC | PRN

## 2022-11-09 MED ORDER — CEFAZOLIN SODIUM-DEXTROSE 2-4 GM/100ML-% IV SOLN
INTRAVENOUS | Status: AC
  Filled 2022-11-09: qty 100

## 2022-11-09 MED ORDER — FENTANYL CITRATE (PF) 100 MCG/2ML IJ SOLN
INTRAMUSCULAR | Status: AC
  Filled 2022-11-09: qty 2

## 2022-11-09 MED ORDER — OXYCODONE HCL 5 MG/5ML PO SOLN: 5.0000 mg | Freq: Once | ORAL | Status: DC | PRN

## 2022-11-09 MED ORDER — EPINEPHRINE PF 1 MG/ML IJ SOLN
INTRAMUSCULAR | Status: AC
  Filled 2022-11-09: qty 1

## 2022-11-09 MED ORDER — LIDOCAINE HCL (PF) 2 % IJ SOLN
INTRAMUSCULAR | Status: AC
  Filled 2022-11-09: qty 5

## 2022-11-09 MED ORDER — DEXAMETHASONE SODIUM PHOSPHATE 10 MG/ML IJ SOLN
INTRAMUSCULAR | Status: AC
  Filled 2022-11-09: qty 1

## 2022-11-09 MED ORDER — PROMETHAZINE HCL 25 MG/ML IJ SOLN: 6.2500 mg | INTRAMUSCULAR | Status: DC | PRN

## 2022-11-09 MED ORDER — PHENYLEPHRINE 80 MCG/ML (10ML) SYRINGE FOR IV PUSH (FOR BLOOD PRESSURE SUPPORT)
PREFILLED_SYRINGE | INTRAVENOUS | Status: DC | PRN
  Administered 2022-11-09 (×2): 80 ug via INTRAVENOUS

## 2022-11-09 MED ORDER — FENTANYL CITRATE (PF) 100 MCG/2ML IJ SOLN: 25.0000 ug | INTRAMUSCULAR | Status: DC | PRN

## 2022-11-09 MED ORDER — SUCCINYLCHOLINE CHLORIDE 200 MG/10ML IV SOSY
PREFILLED_SYRINGE | INTRAVENOUS | Status: AC
  Filled 2022-11-09: qty 10

## 2022-11-09 MED ORDER — ONDANSETRON HCL 4 MG/2ML IJ SOLN
INTRAMUSCULAR | Status: DC | PRN
  Administered 2022-11-09: 4 mg via INTRAVENOUS

## 2022-11-09 SURGICAL SUPPLY — 29 items
BLADE SURG 15 STRL LF DISP TIS (BLADE) ×1 IMPLANT
BLADE SURG 15 STRL SS (BLADE) ×1
BRIEF MESH DISP 2XL (UNDERPADS AND DIAPERS) ×1 IMPLANT
DRAPE LAPAROTOMY 100X77 ABD (DRAPES) ×1 IMPLANT
ELECT CAUTERY BLADE TIP 2.5 (TIP) ×1
ELECT REM PT RETURN 9FT ADLT (ELECTROSURGICAL) ×1
ELECTRODE CAUTERY BLDE TIP 2.5 (TIP) ×1 IMPLANT
ELECTRODE REM PT RTRN 9FT ADLT (ELECTROSURGICAL) ×1 IMPLANT
GAUZE 4X4 16PLY ~~LOC~~+RFID DBL (SPONGE) ×1 IMPLANT
GAUZE PACKING 0.25INX5YD STRL (GAUZE/BANDAGES/DRESSINGS) ×1 IMPLANT
GAUZE SPONGE 4X4 12PLY STRL (GAUZE/BANDAGES/DRESSINGS) ×1 IMPLANT
GAUZE VASELINE 3X9 (GAUZE/BANDAGES/DRESSINGS) ×1 IMPLANT
GLOVE ORTHO TXT STRL SZ7.5 (GLOVE) ×2 IMPLANT
GOWN STRL REUS W/ TWL LRG LVL3 (GOWN DISPOSABLE) ×1 IMPLANT
GOWN STRL REUS W/ TWL XL LVL3 (GOWN DISPOSABLE) ×1 IMPLANT
GOWN STRL REUS W/TWL LRG LVL3 (GOWN DISPOSABLE) ×2
GOWN STRL REUS W/TWL XL LVL3 (GOWN DISPOSABLE) ×1
KIT TURNOVER KIT A (KITS) ×1 IMPLANT
MANIFOLD NEPTUNE II (INSTRUMENTS) ×1 IMPLANT
NDL HYPO 22X1.5 SAFETY MO (MISCELLANEOUS) ×2 IMPLANT
NEEDLE HYPO 22X1.5 SAFETY MO (MISCELLANEOUS) ×1 IMPLANT
PACK BASIN MINOR ARMC (MISCELLANEOUS) ×1 IMPLANT
PAD ABD DERMACEA PRESS 5X9 (GAUZE/BANDAGES/DRESSINGS) IMPLANT
SOL PREP PVP 2OZ (MISCELLANEOUS) ×2
SOLUTION PREP PVP 2OZ (MISCELLANEOUS) ×1 IMPLANT
SURGILUBE 2OZ TUBE FLIPTOP (MISCELLANEOUS) IMPLANT
SYR 10ML LL (SYRINGE) ×1 IMPLANT
SYR 20ML LL LF (SYRINGE) ×2 IMPLANT
TRAP FLUID SMOKE EVACUATOR (MISCELLANEOUS) ×1 IMPLANT

## 2022-11-09 NOTE — Op Note (Signed)
  11/09/2022  1:28 PM  PATIENT:  [name]  57 y.o. female  PRE-OPERATIVE DIAGNOSIS: Chronic pilonidal cyst  POST-OPERATIVE DIAGNOSIS:  Same  PROCEDURE:  1.  Excision of pilonidal cyst                              SURGEON:  Surgeon(s) and Role:    Campbell Lerner, MD - Primary   ANESTHESIA: GETA  INDICATIONS FOR PROCEDURE Pilonidal abscess history, with recurring inflammatory changes of pilonidal cyst  DICTATION:  Patient engaged in discussion about proceeding,  detail of procedure, risk benefits possible complications and a consent was obtained. The patient taken to the operating room and placed in the prone position.  A timeout was completed.   Attempts to cannulate a presumed pilonidal sinus were unsuccessful even with a small 24-gauge Angiocath.  The prior I&D site overlying the residual cyst was then incised centrally over the palpable cyst.  Elliptical excision of the roof of the cyst were then completed sharply, and hemostasis maintained with electrocautery.  Local infiltration of quarter percent Marcaine with epinephrine is instilled to the surrounding area.  With a combination of Angiocath's, probes and visual evaluation I was not able to identify any roots, tunneling or tracts that would indicate persistent pilonidal disease with foreign body.  No hair was identified.  The prior cavity was clean without evidence of pus or drainage.  Sharp debridement was completed with a 15 blade and lieu of a bone curette.  Final wound did not send excision site was approximately 3 cm in length by approximately 1 cm in width with approximately 1 cm in depth.   Hemostasis was obtained with electrocautery. Irrigation with normal saline and the wound was packed with quarter-inch packing. Marcaine quarter percent with epinephrine was injected around the wound site, and the packing strip was wet with it..  Saline gauze was placed to keep the wound moist, and ABD pad applied.  Needle and  laparotomy counts were correct and there were no immediate complications  Campbell Lerner, MD

## 2022-11-09 NOTE — Anesthesia Procedure Notes (Signed)
Procedure Name: Intubation Date/Time: 11/09/2022 12:49 PM  Performed by: Morene Crocker, CRNAPre-anesthesia Checklist: Patient identified, Patient being monitored, Timeout performed, Emergency Drugs available and Suction available Patient Re-evaluated:Patient Re-evaluated prior to induction Oxygen Delivery Method: Circle system utilized Preoxygenation: Pre-oxygenation with 100% oxygen Induction Type: IV induction Ventilation: Mask ventilation without difficulty Laryngoscope Size: 3 and McGraph Grade View: Grade I Tube type: Oral Tube size: 6.5 mm Number of attempts: 1 Airway Equipment and Method: Stylet Placement Confirmation: ETT inserted through vocal cords under direct vision, positive ETCO2 and breath sounds checked- equal and bilateral Secured at: 21 cm Tube secured with: Tape Dental Injury: Teeth and Oropharynx as per pre-operative assessment  Comments: Smooth, atraumatic intubation. No complications noted.

## 2022-11-09 NOTE — Discharge Instructions (Signed)

## 2022-11-09 NOTE — Transfer of Care (Signed)
Immediate Anesthesia Transfer of Care Note  Patient: [name]  Procedure(s) Performed: CYST EXCISION (Coccyx)  Patient Location: PACU  Anesthesia Type:General  Level of Consciousness: drowsy  Airway & Oxygen Therapy: Patient Spontanous Breathing  Post-op Assessment: Report given to RN and Post -op Vital signs reviewed and stable  Post vital signs: Reviewed and stable  Last Vitals:  Vitals Value Taken Time  BP 107/63 11/09/22 1336  Temp 37 C 11/09/22 1336  Pulse 79 11/09/22 1341  Resp 17 11/09/22 1341  SpO2 96 % 11/09/22 1341  Vitals shown include unvalidated device data.  Last Pain:  Vitals:   11/09/22 1012  TempSrc: Temporal  PainSc: 0-No pain         Complications: No notable events documented.

## 2022-11-09 NOTE — Anesthesia Preprocedure Evaluation (Signed)
Anesthesia Evaluation  Patient identified by MRN, date of birth, ID band Patient awake    Reviewed: Allergy & Precautions, H&P , NPO status , Patient's Chart, lab work & pertinent test results, reviewed documented beta blocker date and time   Airway Mallampati: II  TM Distance: >3 FB Neck ROM: full    Dental  (+) Teeth Intact   Pulmonary neg pulmonary ROS, former smoker   Pulmonary exam normal        Cardiovascular Exercise Tolerance: Good negative cardio ROS Normal cardiovascular exam Rhythm:regular Rate:Normal     Neuro/Psych  PSYCHIATRIC DISORDERS       Neuromuscular disease    GI/Hepatic negative GI ROS, Neg liver ROS,,,  Endo/Other  negative endocrine ROS    Renal/GU negative Renal ROS  negative genitourinary   Musculoskeletal   Abdominal   Peds  Hematology negative hematology ROS (+)   Anesthesia Other Findings Past Medical History: No date: Adenomatous polyp of ascending colon No date: Atypical chest pain No date: DDD (degenerative disc disease), lumbar No date: High cholesterol No date: Hyperlipidemia 10/2022: Pilonidal cyst Past Surgical History: 2000: ABDOMINAL HYSTERECTOMY 01/14/2012: BREAST BIOPSY; Right     Comment:  core/clip neg 05/27/2021: COLONOSCOPY WITH PROPOFOL; N/A     Comment:  Procedure: COLONOSCOPY WITH PROPOFOL;  Surgeon: Toney Reil, MD;  Location: [facility] ENDOSCOPY;  Service:               Gastroenterology;  Laterality: N/A;   Reproductive/Obstetrics negative OB ROS                             Anesthesia Physical Anesthesia Plan  ASA: 2  Anesthesia Plan: General ETT   Post-op Pain Management:    Induction:   PONV Risk Score and Plan: 4 or greater  Airway Management Planned:   Additional Equipment:   Intra-op Plan:   Post-operative Plan:   Informed Consent: I have reviewed the patients History and Physical, chart, labs  and discussed the procedure including the risks, benefits and alternatives for the proposed anesthesia with the patient or authorized representative who has indicated his/her understanding and acceptance.     Dental Advisory Given  Plan Discussed with: CRNA  Anesthesia Plan Comments:        Anesthesia Quick Evaluation

## 2022-11-09 NOTE — Interval H&P Note (Signed)
History and Physical Interval Note:  11/09/2022 11:46 AM  [name]  has presented today for surgery, with the diagnosis of pilonidal cyst.  The various methods of treatment have been discussed with the patient and family. After consideration of risks, benefits and other options for treatment, the patient has consented to  Procedure(s): CYST EXCISION PILONIDAL EXTENSIVE (N/A) as a surgical intervention.  The patient's history has been reviewed, patient examined, no change in status, stable for surgery.  I have reviewed the patient's chart and labs.  Questions were answered to the patient's satisfaction.     Campbell Lerner

## 2022-11-10 ENCOUNTER — Encounter: Payer: Self-pay | Admitting: Surgery

## 2022-11-10 NOTE — Anesthesia Postprocedure Evaluation (Signed)
Anesthesia Post Note  Patient: [name]  Procedure(s) Performed: CYST EXCISION (Coccyx)  Patient location during evaluation: PACU Anesthesia Type: General Level of consciousness: awake and alert Pain management: pain level controlled Vital Signs Assessment: post-procedure vital signs reviewed and stable Respiratory status: spontaneous breathing, nonlabored ventilation, respiratory function stable and patient connected to nasal cannula oxygen Cardiovascular status: blood pressure returned to baseline and stable Postop Assessment: no apparent nausea or vomiting Anesthetic complications: no   No notable events documented.   Last Vitals:  Vitals:   11/09/22 1400 11/09/22 1414  BP: 101/74 109/74  Pulse: 71 (!) 58  Resp: 12 18  Temp:  (!) 36.3 C  SpO2: 99% 100%    Last Pain:  Vitals:   11/09/22 1414  TempSrc: Temporal  PainSc: 0-No pain                 Yevette Edwards

## 2022-11-12 ENCOUNTER — Encounter: Payer: Self-pay | Admitting: Family Medicine

## 2022-11-19 ENCOUNTER — Ambulatory Visit (INDEPENDENT_AMBULATORY_CARE_PROVIDER_SITE_OTHER): Payer: 59 | Admitting: Physician Assistant

## 2022-11-19 ENCOUNTER — Encounter: Payer: Self-pay | Admitting: Physician Assistant

## 2022-11-19 VITALS — BP 112/73 | HR 62 | Temp 98.4°F | Ht 64.0 in | Wt 150.8 lb

## 2022-11-19 DIAGNOSIS — Z09 Encounter for follow-up examination after completed treatment for conditions other than malignant neoplasm: Secondary | ICD-10-CM

## 2022-11-19 DIAGNOSIS — L0591 Pilonidal cyst without abscess: Secondary | ICD-10-CM

## 2022-11-19 NOTE — Patient Instructions (Signed)
Pilonidal Cyst Removal, Care After The following information offers guidance on how to care for yourself after your procedure. Your health care provider may also give you more specific instructions. If you have problems or questions, contact your health care provider. What can I expect after the procedure? After the procedure, it is common to have: Pain. Redness. Some swelling. Some fluid or blood coming from your incision (drainage). You may have more drainage if you have an open incision. Follow these instructions at home: Medicines Take over-the-counter and prescription medicines only as told by your health care provider. If you were prescribed antibiotics, take them as told by your health care provider. Do not stop using the antibiotic even if you start to feel better. Ask your health care provider if the medicine prescribed to you: Requires you to avoid driving or using machinery. Can cause constipation. You may need to take these actions to prevent or treat constipation: Drink enough fluid to keep your urine pale yellow. Take over-the-counter or prescription medicines. Eat foods that are high in fiber, such as beans, whole grains, and fresh fruits and vegetables. Limit foods that are high in fat and processed sugars, such as fried or sweet foods. Incision care  You may need to have a caregiver help you with wound care and dressing changes. Follow instructions from your health care provider about how to take care of your incision. Make sure you: Wash your hands with soap and water for at least 20 seconds before and after you change your bandage (dressing). If soap and water are not available, use hand sanitizer. Change your dressing as told by your health care provider. Leave stitches (sutures), skin glue, or adhesive strips in place. These skin closures may need to stay in place for 2 weeks or longer. If adhesive strip edges start to loosen and curl up, you may trim the loose edges. Do  not remove adhesive strips completely unless your health care provider tells you to do that. Check your incision area every day for signs of infection. If it is hard to see the area, have someone check for you. Check for: More redness, swelling, or pain. More fluid or blood. Warmth. Pus or a bad smell. Managing pain and swelling If directed, put ice on the affected area. To do this: Put ice in a plastic bag. Place a towel between your skin and the bag. Leave the ice on for 20 minutes, 2-3 times a day. If your skin turns bright red, remove the ice right away to prevent skin damage. The risk of skin damage is higher if you cannot feel pain, heat, or cold. Activity Do not do activities that cause pain or irritate the incision area. These may include bike riding, running, sit ups, or anything that involves a twisting motion. Rest as told by your health care provider. Do not sit for a long time without moving. Get up to take short walks every 1-2 hours. This will improve blood flow and breathing. Ask for help if you feel weak or unsteady. Return to your normal activities as told by your health care provider. Ask your health care provider what activities are safe for you. General instructions Do not use any products that contain nicotine or tobacco. These products include cigarettes, chewing tobacco, and vaping devices, such as e-cigarettes. These can delay healing after surgery. If you need help quitting, ask your health care provider. Do not take baths, swim, or use a hot tub until your health care provider approves.   Ask your health care provider if you may take showers. You may only be allowed to take sponge baths. Keep all follow-up visits. This is important to monitor healing. If you had a procedure with wound packing, your packing may be changed or removed at follow-up visits. Contact a health care provider if: You have pain that does not get better with medicine. You have any of these signs  of infection: More redness, swelling, or pain around your incision. More fluid or blood coming from your incision. Warmth coming from your incision. Pus or a bad smell coming from your incision. A fever. Get help right away if: You have severe pain in your abdomen. You have sudden chest pain and shortness of breath. You cough up blood. You faint or lose consciousness. These symptoms may be an emergency. Get help right away. Call 911. Do not wait to see if the symptoms will go away. Do not drive yourself to the hospital. Summary It is common to have some pain and drainage after your procedure. You may have more drainage if you have an open incision. You may need to have a caregiver help you with wound care and dressing changes. Do not do activities that cause pain or irritate the incision area. Contact your health care provider if you have pain that does not get better with medicine or if you have any signs of infection. This information is not intended to replace advice given to you by your health care provider. Make sure you discuss any questions you have with your health care provider. Document Revised: 08/15/2021 Document Reviewed: 08/15/2021 Elsevier Patient Education  2024 [employer] Inc[employer]  

## 2022-11-19 NOTE — Progress Notes (Signed)
[facility] POST-OP OFFICE VISIT  11/19/2022  HPI: [name] is a 57 y.o. female 10 days s/p pilonidal cyst excision  She is overall doing well Family has been assisting with dressing changes Sore but improving Fever, chills   Vital signs: BP 112/73   Pulse 62   Temp 98.4 F (36.9 C) (Oral)   Ht 5\' 4"  (1.626 m)   Wt 150 lb 12.8 oz (68.4 kg)   SpO2 98%   BMI 25.88 kg/m    Physical Exam: Constitutional: Well appearing female, NAD Skin: Chaperone present, 1.5 cm x  0.5 cm x <0.5 cm pilonidal cyst excision site to the left superior pilonidal cleft, wound bed is 100% granulation tissue  Assessment/Plan: This is a 57 y.o. female 10 days s/p pilonidal cyst excision   - Pain control prn  - Reviewed wound care recommendation; continue packing daily. Anticipate being able to cease packing in the next few days  - I will see her again in ~2 weeks for wound check; She understands to call with questions/concerns  -- Lynden Oxford, PA-C [facility] 11/19/2022, 1:19 PM M-F: 7am - 4pm

## 2022-11-24 ENCOUNTER — Encounter: Payer: 59 | Admitting: Physician Assistant

## 2022-12-03 ENCOUNTER — Encounter: Payer: Self-pay | Admitting: Physician Assistant

## 2022-12-03 ENCOUNTER — Ambulatory Visit (INDEPENDENT_AMBULATORY_CARE_PROVIDER_SITE_OTHER): Payer: 59 | Admitting: Physician Assistant

## 2022-12-03 VITALS — BP 105/68 | HR 61 | Temp 98.4°F | Ht 64.0 in | Wt 153.2 lb

## 2022-12-03 DIAGNOSIS — L0591 Pilonidal cyst without abscess: Secondary | ICD-10-CM | POA: Diagnosis not present

## 2022-12-03 DIAGNOSIS — Z09 Encounter for follow-up examination after completed treatment for conditions other than malignant neoplasm: Secondary | ICD-10-CM | POA: Diagnosis not present

## 2022-12-03 NOTE — Progress Notes (Signed)
[facility] POST-OP OFFICE VISIT  12/03/2022  HPI: [name] is a 57 y.o. female 24 days s/p pilonidal cyst excision   She is doing well No pain, fever, chills Wound is closed; no drainage No other complaints   Vital signs: BP 105/68   Pulse 61   Temp 98.4 F (36.9 C) (Oral)   Ht 5\' 4"  (1.626 m)   Wt 153 lb 3.2 oz (69.5 kg)   SpO2 97%   BMI 26.30 kg/m    Physical Exam: Constitutional: Well appearing female, NAD Skin: Marylene Land present as chaperone, pilonidal excision site is closed, no erythema, no drainage  Assessment/Plan: This is a 57 y.o. female  24 days s/p pilonidal cyst excision   - Nothing further from surgical perspective   - Reviewed wound care recommendation  - She can follow up on as needed basis; She understands to call with questions/concerns  -- Lynden Oxford, PA-C [facility] 12/03/2022, 2:31 PM M-F: 7am - 4pm

## 2022-12-03 NOTE — Patient Instructions (Signed)

## 2023-02-18 ENCOUNTER — Ambulatory Visit: Payer: Self-pay | Admitting: Surgery

## 2023-02-18 ENCOUNTER — Ambulatory Visit: Payer: 59 | Admitting: Surgery

## 2023-02-18 ENCOUNTER — Encounter: Payer: Self-pay | Admitting: Surgery

## 2023-02-18 VITALS — BP 104/66 | HR 59 | Temp 98.2°F | Ht 64.0 in | Wt 156.0 lb

## 2023-02-18 DIAGNOSIS — L0591 Pilonidal cyst without abscess: Secondary | ICD-10-CM | POA: Diagnosis not present

## 2023-02-18 NOTE — Progress Notes (Signed)
Patient ID: [name], female   DOB: 12/17/1965, 57 y.o.   MRN: [id number]  Chief Complaint: Pilonidal cyst recurrent.  History of Present Illness She is only 3 months out from having a recent pilonidal cyst excision.  She healed rather quickly within a month, and about a month ago began having swelling and tenderness to the area which spontaneously drained.  She reports he gets tender with sweating. [name] is a 57 y.o. female with a prior pilonidal abscess drained in 2016.  Since that time she has had some persistent intermittent discomfort along with spontaneous drainage.  Denies any prior excision, has never been treated with antibiotics for the same.  Past Medical History Past Medical History:  Diagnosis Date   Adenomatous polyp of ascending colon    Atypical chest pain    DDD (degenerative disc disease), lumbar    High cholesterol    Hyperlipidemia    Pilonidal cyst 10/2022      Past Surgical History:  Procedure Laterality Date   ABDOMINAL HYSTERECTOMY  2000   BREAST BIOPSY Right 01/14/2012   core/clip neg   COLONOSCOPY WITH PROPOFOL N/A 05/27/2021   Procedure: COLONOSCOPY WITH PROPOFOL;  Surgeon: Toney Reil, MD;  Location: [facility] END[facility];  Service: Gastroenterology;  Laterality: N/A;   PILONIDAL CYST EXCISION N/A 11/09/2022   Procedure: CYST EXCISION;  Surgeon: Campbell Lerner, MD;  Location: [facility] ORS;  Service: General;  Laterality: N/A;    Allergies  Allergen Reactions   Cetirizine     Nasal spray Irritated nose    Current Outpatient Medications  Medication Sig Dispense Refill   aspirin 81 MG tablet Take 81 mg by mouth daily.     loratadine (CLARITIN) 10 MG tablet Take 10 mg by mouth daily.     nitroGLYCERIN (NITROSTAT) 0.4 MG SL tablet Place 1 tablet (0.4 mg total) under the tongue every 5 (five) minutes as needed for chest pain. 30 tablet 2   pantoprazole (PROTONIX) 40 MG tablet Take 1 tablet (40 mg total) by mouth daily before breakfast. 90  tablet 1   sucralfate (CARAFATE) 1 g tablet TAKE 1 TABLET BY MOUTH 4 TIMES DAILY WITH MEALS AND AT BEDTIME AS NEEDED FOR ABDOMINAL PAIN 30 tablet 0   No current facility-administered medications for this visit.    Family History Family History  Problem Relation Age of Onset   Diabetes Mother    Breast cancer Sister 18   Diabetes Daughter    Breast cancer Paternal Aunt    Breast cancer Paternal Aunt    Hearing loss Neg Hx    Hypertension Neg Hx    Hyperlipidemia Neg Hx       Social History Social History   Tobacco Use   Smoking status: Former    Current packs/day: 0.00    Average packs/day: 1 pack/day for 30.0 years (30.0 ttl pk-yrs)    Types: Cigarettes, E-cigarettes    Start date: 06/25/1990    Quit date: 06/25/2020    Years since quitting: 2.6    Passive exposure: Past   Smokeless tobacco: Never  Vaping Use   Vaping status: Some Days   Substances: Nicotine  Substance Use Topics   Alcohol use: Yes    Comment: occassional   Drug use: Not Currently    Types: Cocaine    Comment: 6 years ago (2018)        Review of Systems  Constitutional: Negative.   HENT: Negative.    Eyes: Negative.   Respiratory: Negative.  Cardiovascular: Negative.   Gastrointestinal: Negative.   Genitourinary: Negative.   Skin: Negative.   Neurological: Negative.   Psychiatric/Behavioral: Negative.       Physical Exam Blood pressure 104/66, pulse (!) 59, temperature 98.2 F (36.8 C), height 5\' 4"  (1.626 m), weight 156 lb (70.8 kg), SpO2 98%. Last Weight  Most recent update: 02/18/2023  2:04 PM    Weight  70.8 kg (156 lb)              CONSTITUTIONAL: Well developed, and nourished, appropriately responsive and aware without distress.   EYES: Sclera non-icteric.   EARS, NOSE, MOUTH AND THROAT:  The oropharynx is clear. Oral mucosa is pink and moist.    Hearing is intact to voice.  NECK: Trachea is midline, and there is no jugular venous distension.  LYMPH NODES:  Lymph nodes  in the neck are not appreciated. RESPIRATORY:  Lungs are clear, and breath sounds are equal bilaterally.  Normal respiratory effort without pathologic use of accessory muscles. CARDIOVASCULAR: Heart is regular in rate and rhythm.   Well perfused.  GI: The abdomen is  soft, nontender, and nondistended. There were no palpable masses.  GU: [name] is present as chaperone: There is an isolated tiny pilonidal pit in the sacral region, adjacent to that on the left there is a firm residual cyst/scar present.  Approximately 2 cm in size, no fluctuance, induration or erythema present.  There is no dermal hair in the vicinity.  Minimally tender.  MUSCULOSKELETAL:  Symmetrical muscle tone appreciated in all four extremities.    SKIN: Skin turgor is normal. No pathologic skin lesions appreciated.  NEUROLOGIC:  Motor and sensation appear grossly normal.  Cranial nerves are grossly without defect. PSYCH:  Alert and oriented to person, place and time. Affect is appropriate for situation.  Data Reviewed I have personally reviewed what is currently available of the patient's imaging, recent labs and medical records.   Labs:     Latest Ref Rng & Units 09/14/2022    4:18 PM 02/26/2022   11:40 AM 04/21/2021    1:49 PM  CBC  WBC 3.8 - 10.8 Thousand/uL 8.4  7.2  6.5   Hemoglobin 11.7 - 15.5 g/dL 09.8  11.9  14.7   Hematocrit 35.0 - 45.0 % 42.6  41.2  40.0   Platelets 140 - 400 Thousand/uL 214  194  185       Latest Ref Rng & Units 09/14/2022    4:18 PM 02/26/2022   11:40 AM 04/21/2021    1:49 PM  CMP  Glucose 65 - 139 mg/dL 88  92  829   BUN 7 - 25 mg/dL 14  23  16    Creatinine 0.50 - 1.03 mg/dL 5.62  1.30  8.65   Sodium 135 - 146 mmol/L 142  141  142   Potassium 3.5 - 5.3 mmol/L 4.4  4.2  3.9   Chloride 98 - 110 mmol/L 106  105  109   CO2 20 - 32 mmol/L 30  30  25    Calcium 8.6 - 10.4 mg/dL 9.5  9.7  9.2   Total Protein 6.1 - 8.1 g/dL 7.2  7.1  6.4   Total Bilirubin 0.2 - 1.2 mg/dL 0.3  0.5  0.4    AST 10 - 35 U/L 13  16  23    ALT 6 - 29 U/L 10  18  26        Imaging: Radiological images reviewed:   Within last 24  hrs: No results found.  Assessment    Pilonidal cyst, recurrent. Patient Active Problem List   Diagnosis Date Noted   Pilonidal cyst of natal cleft 11/09/2022   Positive colorectal cancer screening using Cologuard test    Adenomatous polyp of ascending colon    Elevated hemoglobin A1c 08/03/2017   Chronic right-sided low back pain with right-sided sciatica 07/26/2017   DDD (degenerative disc disease), lumbar 07/26/2017   Lipoma of chest wall 05/27/2017   Hyperlipidemia 04/09/2016   Adjustment disorder 04/09/2016   Nicotine dependence 04/09/2016   Colon cancer screening 04/09/2016   Lump or mass in breast 03/08/2013    Plan    RE-Excision of pilonidal cyst.  We discussed various options of treating pilonidal disease, I advised that we typically do not attempt primary closure of these lesions, due to the high risk of recurrence and challenges with wound healing, with eventual opening of the wound.  Believe she understands that most of her process will be on her postoperative wound care.  And she desires to proceed.  Risks of anesthesia, bleeding, infection, recurrence discussed in detail.  Please she understands and desires to proceed.  Questions answered, no further guarantees ever expressed or implied.  Face-to-face time spent with the patient and accompanying care providers(if present) was 30 minutes, with more than 50% of the time spent counseling, educating, and coordinating care of the patient.    These notes generated with voice recognition software. I apologize for typographical errors.  Campbell Lerner M.D., FACS 02/18/2023, 2:20 PM

## 2023-02-18 NOTE — H&P (View-Only) (Signed)
Patient ID: [name], female   DOB: 12/17/1965, 57 y.o.   MRN: [id number]  Chief Complaint: Pilonidal cyst recurrent.  History of Present Illness She is only 3 months out from having a recent pilonidal cyst excision.  She healed rather quickly within a month, and about a month ago began having swelling and tenderness to the area which spontaneously drained.  She reports he gets tender with sweating. [name] is a 57 y.o. female with a prior pilonidal abscess drained in 2016.  Since that time she has had some persistent intermittent discomfort along with spontaneous drainage.  Denies any prior excision, has never been treated with antibiotics for the same.  Past Medical History Past Medical History:  Diagnosis Date   Adenomatous polyp of ascending colon    Atypical chest pain    DDD (degenerative disc disease), lumbar    High cholesterol    Hyperlipidemia    Pilonidal cyst 10/2022      Past Surgical History:  Procedure Laterality Date   ABDOMINAL HYSTERECTOMY  2000   BREAST BIOPSY Right 01/14/2012   core/clip neg   COLONOSCOPY WITH PROPOFOL N/A 05/27/2021   Procedure: COLONOSCOPY WITH PROPOFOL;  Surgeon: Toney Reil, MD;  Location: [facility];  Service: Gastroenterology;  Laterality: N/A;   PILONIDAL CYST EXCISION N/A 11/09/2022   Procedure: CYST EXCISION;  Surgeon: Campbell Lerner, MD;  Location: [facility] ORS;  Service: General;  Laterality: N/A;    Allergies  Allergen Reactions   Cetirizine     Nasal spray Irritated nose    Current Outpatient Medications  Medication Sig Dispense Refill   aspirin 81 MG tablet Take 81 mg by mouth daily.     loratadine (CLARITIN) 10 MG tablet Take 10 mg by mouth daily.     nitroGLYCERIN (NITROSTAT) 0.4 MG SL tablet Place 1 tablet (0.4 mg total) under the tongue every 5 (five) minutes as needed for chest pain. 30 tablet 2   pantoprazole (PROTONIX) 40 MG tablet Take 1 tablet (40 mg total) by mouth daily before breakfast. 90  tablet 1   sucralfate (CARAFATE) 1 g tablet TAKE 1 TABLET BY MOUTH 4 TIMES DAILY WITH MEALS AND AT BEDTIME AS NEEDED FOR ABDOMINAL PAIN 30 tablet 0   No current facility-administered medications for this visit.    Family History Family History  Problem Relation Age of Onset   Diabetes Mother    Breast cancer Sister 18   Diabetes Daughter    Breast cancer Paternal Aunt    Breast cancer Paternal Aunt    Hearing loss Neg Hx    Hypertension Neg Hx    Hyperlipidemia Neg Hx       Social History Social History   Tobacco Use   Smoking status: Former    Current packs/day: 0.00    Average packs/day: 1 pack/day for 30.0 years (30.0 ttl pk-yrs)    Types: Cigarettes, E-cigarettes    Start date: 06/25/1990    Quit date: 06/25/2020    Years since quitting: 2.6    Passive exposure: Past   Smokeless tobacco: Never  Vaping Use   Vaping status: Some Days   Substances: Nicotine  Substance Use Topics   Alcohol use: Yes    Comment: occassional   Drug use: Not Currently    Types: Cocaine    Comment: 6 years ago (2018)        Review of Systems  Constitutional: Negative.   HENT: Negative.    Eyes: Negative.   Respiratory: Negative.  Cardiovascular: Negative.   Gastrointestinal: Negative.   Genitourinary: Negative.   Skin: Negative.   Neurological: Negative.   Psychiatric/Behavioral: Negative.       Physical Exam Blood pressure 104/66, pulse (!) 59, temperature 98.2 F (36.8 C), height 5\' 4"  (1.626 m), weight 156 lb (70.8 kg), SpO2 98%. Last Weight  Most recent update: 02/18/2023  2:04 PM    Weight  70.8 kg (156 lb)              CONSTITUTIONAL: Well developed, and nourished, appropriately responsive and aware without distress.   EYES: Sclera non-icteric.   EARS, NOSE, MOUTH AND THROAT:  The oropharynx is clear. Oral mucosa is pink and moist.    Hearing is intact to voice.  NECK: Trachea is midline, and there is no jugular venous distension.  LYMPH NODES:  Lymph nodes  in the neck are not appreciated. RESPIRATORY:  Lungs are clear, and breath sounds are equal bilaterally.  Normal respiratory effort without pathologic use of accessory muscles. CARDIOVASCULAR: Heart is regular in rate and rhythm.   Well perfused.  GI: The abdomen is  soft, nontender, and nondistended. There were no palpable masses.  GU: [name] is present as chaperone: There is an isolated tiny pilonidal pit in the sacral region, adjacent to that on the left there is a firm residual cyst/scar present.  Approximately 2 cm in size, no fluctuance, induration or erythema present.  There is no dermal hair in the vicinity.  Minimally tender.  MUSCULOSKELETAL:  Symmetrical muscle tone appreciated in all four extremities.    SKIN: Skin turgor is normal. No pathologic skin lesions appreciated.  NEUROLOGIC:  Motor and sensation appear grossly normal.  Cranial nerves are grossly without defect. PSYCH:  Alert and oriented to person, place and time. Affect is appropriate for situation.  Data Reviewed I have personally reviewed what is currently available of the patient's imaging, recent labs and medical records.   Labs:     Latest Ref Rng & Units 09/14/2022    4:18 PM 02/26/2022   11:40 AM 04/21/2021    1:49 PM  CBC  WBC 3.8 - 10.8 Thousand/uL 8.4  7.2  6.5   Hemoglobin 11.7 - 15.5 g/dL 09.8  11.9  14.7   Hematocrit 35.0 - 45.0 % 42.6  41.2  40.0   Platelets 140 - 400 Thousand/uL 214  194  185       Latest Ref Rng & Units 09/14/2022    4:18 PM 02/26/2022   11:40 AM 04/21/2021    1:49 PM  CMP  Glucose 65 - 139 mg/dL 88  92  829   BUN 7 - 25 mg/dL 14  23  16    Creatinine 0.50 - 1.03 mg/dL 5.62  1.30  8.65   Sodium 135 - 146 mmol/L 142  141  142   Potassium 3.5 - 5.3 mmol/L 4.4  4.2  3.9   Chloride 98 - 110 mmol/L 106  105  109   CO2 20 - 32 mmol/L 30  30  25    Calcium 8.6 - 10.4 mg/dL 9.5  9.7  9.2   Total Protein 6.1 - 8.1 g/dL 7.2  7.1  6.4   Total Bilirubin 0.2 - 1.2 mg/dL 0.3  0.5  0.4    AST 10 - 35 U/L 13  16  23    ALT 6 - 29 U/L 10  18  26        Imaging: Radiological images reviewed:   Within last 24  hrs: No results found.  Assessment    Pilonidal cyst, recurrent. Patient Active Problem List   Diagnosis Date Noted   Pilonidal cyst of natal cleft 11/09/2022   Positive colorectal cancer screening using Cologuard test    Adenomatous polyp of ascending colon    Elevated hemoglobin A1c 08/03/2017   Chronic right-sided low back pain with right-sided sciatica 07/26/2017   DDD (degenerative disc disease), lumbar 07/26/2017   Lipoma of chest wall 05/27/2017   Hyperlipidemia 04/09/2016   Adjustment disorder 04/09/2016   Nicotine dependence 04/09/2016   Colon cancer screening 04/09/2016   Lump or mass in breast 03/08/2013    Plan    RE-Excision of pilonidal cyst.  We discussed various options of treating pilonidal disease, I advised that we typically do not attempt primary closure of these lesions, due to the high risk of recurrence and challenges with wound healing, with eventual opening of the wound.  Believe she understands that most of her process will be on her postoperative wound care.  And she desires to proceed.  Risks of anesthesia, bleeding, infection, recurrence discussed in detail.  Please she understands and desires to proceed.  Questions answered, no further guarantees ever expressed or implied.  Face-to-face time spent with the patient and accompanying care providers(if present) was 30 minutes, with more than 50% of the time spent counseling, educating, and coordinating care of the patient.    These notes generated with voice recognition software. I apologize for typographical errors.  Campbell Lerner M.D., FACS 02/18/2023, 2:20 PM

## 2023-02-18 NOTE — Patient Instructions (Signed)
You have requested to have your Pilonidal Cyst excised. We will do this at [facility] with Dr Claudine Mouton.  If you have FMLA or disability paperwork that needs filled out you may drop this off at our office or this can be faxed to [phone].  Please see the (blue)pre-care form that you have been given today. Our surgery scheduler will call you to verify surgery date and to go over information.   If you have any questions, please call our office.  You will need to arrange to be out of work for approximately 1-2 weeks and then have a family member change the dressing 1-2 times daily until this heals from the inside out.   Please call our office with any questions or concerns.    Pilonidal Cyst A pilonidal cyst is a fluid-filled sac. It forms beneath the skin near your tailbone, at the top of the crease of your buttocks. A pilonidal cyst that is not large or infected may not cause symptoms or problems. If the cyst becomes irritated or infected, it may fill with pus. This causes pain and swelling (pilonidal abscess). An infected cyst may need to be treated with medicine, drained, or removed. CAUSES The cause of a pilonidal cyst is not known. One cause may be a hair that grows into your skin (ingrown hair). RISK FACTORS Pilonidal cysts are more common in boys and men. Risk factors include: Having lots of hair near the crease of the buttocks. Being overweight. Having a pilonidal dimple. Wearing tight clothing. Not bathing or showering frequently. Sitting for long periods of time. SIGNS AND SYMPTOMS Signs and symptoms of a pilonidal cyst may include: Redness. Pain and tenderness. Warmth. Swelling. Pus. Fever. DIAGNOSIS Your health care provider may diagnose a pilonidal cyst based on your symptoms and a physical exam. The health care provider may do a blood test to check for infection. If your cyst is draining pus, your health care provider may take a sample of the drainage to be tested at a  laboratory. TREATMENT Surgery is the usual treatment for an infected pilonidal cyst. You may also have to take medicines before surgery. The type of surgery you have depends on the size and severity of the infected cyst. The different kinds of surgery include: Incision and drainage. This is a procedure to open and drain the cyst. Marsupialization. In this procedure, a large cyst or abscess may be opened and kept open by stitching the edges of the skin to the cyst walls. Cyst removal. This procedure involves opening the skin and removing all or part of the cyst. HOME CARE INSTRUCTIONS Follow all of your surgeon's instructions carefully if you had surgery. Take medicines only as directed by your health care provider. If you were prescribed an antibiotic medicine, finish it all even if you start to feel better. Keep the area around your pilonidal cyst clean and dry. Clean the area as directed by your health care provider. Pat the area dry with a clean towel. Do not rub it as this may cause bleeding. Remove hair from the area around the cyst as directed by your health care provider. Do not wear tight clothing or sit in one place for long periods of time. There are many different ways to close and cover an incision, including stitches, skin glue, and adhesive strips. Follow your health care provider's instructions on: Incision care. Bandage (dressing) changes and removal. Incision closure removal. SEEK MEDICAL CARE IF:  You have drainage, redness, swelling, or pain at  the site of the cyst. You have a fever.   This information is not intended to replace advice given to you by your health care provider. Make sure you discuss any questions you have with your health care provider.   Document Released: 05/08/2000 Document Revised: 06/01/2014 Document Reviewed: 09/28/2013 Elsevier Interactive Patient Education [employer]

## 2023-02-22 ENCOUNTER — Telehealth: Payer: Self-pay | Admitting: Surgery

## 2023-02-22 ENCOUNTER — Other Ambulatory Visit: Payer: Self-pay

## 2023-02-22 ENCOUNTER — Encounter
Admission: RE | Admit: 2023-02-22 | Discharge: 2023-02-22 | Disposition: A | Discharge status: Home / Self Care | Payer: 59 | Source: Ambulatory Visit | Attending: Surgery | Admitting: Surgery

## 2023-02-22 HISTORY — DX: Gastro-esophageal reflux disease without esophagitis: K21.9

## 2023-02-22 HISTORY — DX: Personal history of urinary calculi: Z87.442

## 2023-02-22 HISTORY — DX: Angina pectoris, unspecified: I20.9

## 2023-02-22 NOTE — Patient Instructions (Addendum)
Your procedure is scheduled on: 03/01/23 - Monday Report to the Registration Desk on the 1st floor of the Medical Mall. To find out your arrival time, please call [phone] between 1PM - 3PM on: 02/26/23 - Friday If your arrival time is 6:00 am, do not arrive before that time as the Medical Mall entrance doors do not open until 6:00 am.  REMEMBER: Instructions that are not followed completely may result in serious medical risk, up to and including death; or upon the discretion of your surgeon and anesthesiologist your surgery may need to be rescheduled.  Do not eat food or drink any liquids after midnight the night before surgery.  No gum chewing or hard candies.  One week prior to surgery: Stop Anti-inflammatories (NSAIDS) such as Advil, Aleve, Ibuprofen, Motrin, Naproxen, Naprosyn and Aspirin based products such as Excedrin, Goody's Powder, BC Powder. You may take Tylenol if needed for pain up until the day of surgery.  Stop ANY OVER THE COUNTER supplements until after surgery.   TAKE ONLY THESE MEDICATIONS THE MORNING OF SURGERY WITH A SIP OF WATER:  pantoprazole (PROTONIX) - (take one the night before and one on the morning of surgery - helps to prevent nausea after surgery.)   No Alcohol for 24 hours before or after surgery.  No Smoking including e-cigarettes for 24 hours before surgery.  No chewable tobacco products for at least 6 hours before surgery.  No nicotine patches on the day of surgery.  Do not use any "recreational" drugs for at least a week (preferably 2 weeks) before your surgery.  Please be advised that the combination of cocaine and anesthesia may have negative outcomes, up to and including death. If you test positive for cocaine, your surgery will be cancelled.  On the morning of surgery brush your teeth with toothpaste and water, you may rinse your mouth with mouthwash if you wish. Do not swallow any toothpaste or mouthwash.  Use CHG Soap or wipes as  directed on instruction sheet.  Do not wear jewelry, make-up, hairpins, clips or nail polish.  For welded (permanent) jewelry: bracelets, anklets, waist bands, etc.  Please have this removed prior to surgery.  If it is not removed, there is a chance that hospital personnel will need to cut it off on the day of surgery.  Do not wear lotions, powders, or perfumes.   Do not shave body hair from the neck down 48 hours before surgery.  Contact lenses, hearing aids and dentures may not be worn into surgery.  Do not bring valuables to the hospital. [facility] is not responsible for any missing/lost belongings or valuables.   Notify your doctor if there is any change in your medical condition (cold, fever, infection).  Wear comfortable clothing (specific to your surgery type) to the hospital.  After surgery, you can help prevent lung complications by doing breathing exercises.  Take deep breaths and cough every 1-2 hours. Your doctor may order a device called an Incentive Spirometer to help you take deep breaths. When coughing or sneezing, hold a pillow firmly against your incision with both hands. This is called "splinting." Doing this helps protect your incision. It also decreases belly discomfort.  If you are being admitted to the hospital overnight, leave your suitcase in the car. After surgery it may be brought to your room.  In case of increased patient census, it may be necessary for you, the patient, to continue your postoperative care in the Same Day Surgery department.  If you are being discharged the day of surgery, you will not be allowed to drive home. You will need a responsible individual to drive you home and stay with you for 24 hours after surgery.   If you are taking public transportation, you will need to have a responsible individual with you.  Please call the Pre-admissions Testing Dept. at [phone] if you have any questions about these instructions.  Surgery  Visitation Policy:  Patients having surgery or a procedure may have two visitors.  Children under the age of 24 must have an adult with them who is not the patient.  Inpatient Visitation:    Visiting hours are 7 a.m. to 8 p.m. Up to four visitors are allowed at one time in a patient room. The visitors may rotate out with other people during the day.  One visitor age 57 or older may stay with the patient overnight and must be in the room by 8 p.m.     Preparing for Surgery with CHLORHEXIDINE GLUCONATE (CHG) Soap  Chlorhexidine Gluconate (CHG) Soap  o An antiseptic cleaner that kills germs and bonds with the skin to continue killing germs even after washing  o Used for showering the night before surgery and morning of surgery  Before surgery, you can play an important role by reducing the number of germs on your skin.  CHG (Chlorhexidine gluconate) soap is an antiseptic cleanser which kills germs and bonds with the skin to continue killing germs even after washing.  Please do not use if you have an allergy to CHG or antibacterial soaps. If your skin becomes reddened/irritated stop using the CHG.  1. Shower the NIGHT BEFORE SURGERY and the MORNING OF SURGERY with CHG soap.  2. If you choose to wash your hair, wash your hair first as usual with your normal shampoo.  3. After shampooing, rinse your hair and body thoroughly to remove the shampoo.  4. Use CHG as you would any other liquid soap. You can apply CHG directly to the skin and wash gently with a scrungie or a clean washcloth.  5. Apply the CHG soap to your body only from the neck down. Do not use on open wounds or open sores. Avoid contact with your eyes, ears, mouth, and genitals (private parts). Wash face and genitals (private parts) with your normal soap.  6. Wash thoroughly, paying special attention to the area where your surgery will be performed.  7. Thoroughly rinse your body with warm water.  8. Do not shower/wash  with your normal soap after using and rinsing off the CHG soap.  9. Pat yourself dry with a clean towel.  10. Wear clean pajamas to bed the night before surgery.  12. Place clean sheets on your bed the night of your first shower and do not sleep with pets.  13. Shower again with the CHG soap on the day of surgery prior to arriving at the hospital.  14. Do not apply any deodorants/lotions/powders.  15. Please wear clean clothes to the hospital.

## 2023-02-22 NOTE — Telephone Encounter (Signed)
Patient calls back, she is now informed of all dates regarding surgery.  

## 2023-02-22 NOTE — Telephone Encounter (Signed)
Left message for patient to call, please inform her of the following regarding scheduled surgery with Dr. Claudine Mouton.   Pre-Admission date/time, and Surgery date at [facility]  Surgery Date: 03/01/23 Preadmission Testing Date: 02/22/23 (phone 1p-4p)  Also patient will need to call at [phone], between 1-3:00pm the day before surgery, to find out what time to arrive for surgery.

## 2023-02-24 ENCOUNTER — Other Ambulatory Visit: Payer: Self-pay | Admitting: Family Medicine

## 2023-02-24 DIAGNOSIS — Z1231 Encounter for screening mammogram for malignant neoplasm of breast: Secondary | ICD-10-CM

## 2023-03-01 ENCOUNTER — Other Ambulatory Visit: Payer: Self-pay

## 2023-03-01 ENCOUNTER — Ambulatory Visit: Payer: 59 | Admitting: Urgent Care

## 2023-03-01 ENCOUNTER — Ambulatory Visit
Admission: RE | Admit: 2023-03-01 | Discharge: 2023-03-01 | Disposition: A | Discharge status: Home / Self Care | Payer: 59 | Attending: Surgery | Admitting: Surgery

## 2023-03-01 ENCOUNTER — Encounter
Admission: RE | Disposition: A | Discharge status: Home / Self Care | Payer: Self-pay | Source: Home / Self Care | Attending: Surgery

## 2023-03-01 DIAGNOSIS — K219 Gastro-esophageal reflux disease without esophagitis: Secondary | ICD-10-CM | POA: Diagnosis not present

## 2023-03-01 DIAGNOSIS — J449 Chronic obstructive pulmonary disease, unspecified: Secondary | ICD-10-CM | POA: Diagnosis not present

## 2023-03-01 DIAGNOSIS — L0591 Pilonidal cyst without abscess: Secondary | ICD-10-CM | POA: Diagnosis present

## 2023-03-01 DIAGNOSIS — Z87891 Personal history of nicotine dependence: Secondary | ICD-10-CM | POA: Insufficient documentation

## 2023-03-01 HISTORY — PX: PILONIDAL CYST EXCISION: SHX744

## 2023-03-01 SURGERY — EXCISION, PILONIDAL CYST, EXTENSIVE
Anesthesia: General | Site: Back

## 2023-03-01 MED ORDER — METHYLENE BLUE (ANTIDOTE) 1 % IV SOLN
INTRAVENOUS | Status: AC
  Filled 2023-03-01: qty 10

## 2023-03-01 MED ORDER — CEFAZOLIN SODIUM-DEXTROSE 2-4 GM/100ML-% IV SOLN
INTRAVENOUS | Status: AC
  Filled 2023-03-01: qty 100

## 2023-03-01 MED ORDER — PHENYLEPHRINE 80 MCG/ML (10ML) SYRINGE FOR IV PUSH (FOR BLOOD PRESSURE SUPPORT)
PREFILLED_SYRINGE | INTRAVENOUS | Status: DC | PRN
  Administered 2023-03-01 (×2): 80 ug via INTRAVENOUS

## 2023-03-01 MED ORDER — PROPOFOL 10 MG/ML IV BOLUS
INTRAVENOUS | Status: AC
  Filled 2023-03-01: qty 20

## 2023-03-01 MED ORDER — LIDOCAINE HCL (PF) 2 % IJ SOLN
INTRAMUSCULAR | Status: AC
  Filled 2023-03-01: qty 5

## 2023-03-01 MED ORDER — EPHEDRINE SULFATE-NACL 50-0.9 MG/10ML-% IV SOSY
PREFILLED_SYRINGE | INTRAVENOUS | Status: DC | PRN
  Administered 2023-03-01 (×2): 5 mg via INTRAVENOUS

## 2023-03-01 MED ORDER — FENTANYL CITRATE (PF) 100 MCG/2ML IJ SOLN: 25.0000 ug | INTRAMUSCULAR | Status: DC | PRN

## 2023-03-01 MED ORDER — IBUPROFEN 800 MG PO TABS: 800.0000 mg | ORAL_TABLET | Freq: Three times a day (TID) | ORAL | 0 refills | Status: AC | PRN

## 2023-03-01 MED ORDER — PROPOFOL 10 MG/ML IV BOLUS
INTRAVENOUS | Status: DC | PRN
  Administered 2023-03-01: 150 mg via INTRAVENOUS

## 2023-03-01 MED ORDER — CHLORHEXIDINE GLUCONATE CLOTH 2 % EX PADS: 6.0000 | MEDICATED_PAD | Freq: Once | CUTANEOUS | Status: DC

## 2023-03-01 MED ORDER — CHLORHEXIDINE GLUCONATE 0.12 % MT SOLN
15.0000 mL | Freq: Once | OROMUCOSAL | Status: AC
  Administered 2023-03-01: 15 mL via OROMUCOSAL

## 2023-03-01 MED ORDER — BUPIVACAINE-EPINEPHRINE 0.25% -1:200000 IJ SOLN
INTRAMUSCULAR | Status: DC | PRN
  Administered 2023-03-01: 30 mL

## 2023-03-01 MED ORDER — ORAL CARE MOUTH RINSE: 15.0000 mL | Freq: Once | OROMUCOSAL | Status: AC

## 2023-03-01 MED ORDER — GABAPENTIN 300 MG PO CAPS
ORAL_CAPSULE | ORAL | Status: AC
  Filled 2023-03-01: qty 1

## 2023-03-01 MED ORDER — LACTATED RINGERS IV SOLN: INTRAVENOUS | Status: DC

## 2023-03-01 MED ORDER — BUPIVACAINE-EPINEPHRINE (PF) 0.25% -1:200000 IJ SOLN
INTRAMUSCULAR | Status: AC
  Filled 2023-03-01: qty 30

## 2023-03-01 MED ORDER — FENTANYL CITRATE (PF) 100 MCG/2ML IJ SOLN
INTRAMUSCULAR | Status: AC
  Filled 2023-03-01: qty 2

## 2023-03-01 MED ORDER — DEXAMETHASONE SODIUM PHOSPHATE 10 MG/ML IJ SOLN
INTRAMUSCULAR | Status: DC | PRN
  Administered 2023-03-01: 10 mg via INTRAVENOUS

## 2023-03-01 MED ORDER — FENTANYL CITRATE (PF) 100 MCG/2ML IJ SOLN
INTRAMUSCULAR | Status: DC | PRN
  Administered 2023-03-01 (×2): 50 ug via INTRAVENOUS

## 2023-03-01 MED ORDER — ONDANSETRON HCL 4 MG/2ML IJ SOLN
INTRAMUSCULAR | Status: AC
  Filled 2023-03-01: qty 6

## 2023-03-01 MED ORDER — CHLORHEXIDINE GLUCONATE 0.12 % MT SOLN
OROMUCOSAL | Status: AC
  Filled 2023-03-01: qty 15

## 2023-03-01 MED ORDER — CELECOXIB 200 MG PO CAPS
200.0000 mg | ORAL_CAPSULE | ORAL | Status: AC
  Administered 2023-03-01: 200 mg via ORAL

## 2023-03-01 MED ORDER — GABAPENTIN 300 MG PO CAPS
300.0000 mg | ORAL_CAPSULE | ORAL | Status: AC
  Administered 2023-03-01: 300 mg via ORAL

## 2023-03-01 MED ORDER — ONDANSETRON HCL 4 MG/2ML IJ SOLN
INTRAMUSCULAR | Status: DC | PRN
  Administered 2023-03-01: 4 mg via INTRAVENOUS

## 2023-03-01 MED ORDER — BUPIVACAINE LIPOSOME 1.3 % IJ SUSP: 20.0000 mL | Freq: Once | INTRAMUSCULAR | Status: DC

## 2023-03-01 MED ORDER — MIDAZOLAM HCL 2 MG/2ML IJ SOLN
INTRAMUSCULAR | Status: DC | PRN
  Administered 2023-03-01: 2 mg via INTRAVENOUS

## 2023-03-01 MED ORDER — ACETAMINOPHEN 500 MG PO TABS
1000.0000 mg | ORAL_TABLET | ORAL | Status: AC
  Administered 2023-03-01: 1000 mg via ORAL

## 2023-03-01 MED ORDER — MIDAZOLAM HCL 2 MG/2ML IJ SOLN
INTRAMUSCULAR | Status: AC
  Filled 2023-03-01: qty 2

## 2023-03-01 MED ORDER — ACETAMINOPHEN 500 MG PO TABS
ORAL_TABLET | ORAL | Status: AC
  Filled 2023-03-01: qty 2

## 2023-03-01 MED ORDER — ROCURONIUM BROMIDE 100 MG/10ML IV SOLN
INTRAVENOUS | Status: DC | PRN
  Administered 2023-03-01: 50 mg via INTRAVENOUS

## 2023-03-01 MED ORDER — HYDROCODONE-ACETAMINOPHEN 5-325 MG PO TABS: 1.0000 | ORAL_TABLET | Freq: Four times a day (QID) | ORAL | 0 refills | Status: DC | PRN

## 2023-03-01 MED ORDER — CELECOXIB 200 MG PO CAPS
ORAL_CAPSULE | ORAL | Status: AC
  Filled 2023-03-01: qty 1

## 2023-03-01 MED ORDER — LIDOCAINE HCL (CARDIAC) PF 100 MG/5ML IV SOSY
PREFILLED_SYRINGE | INTRAVENOUS | Status: DC | PRN
  Administered 2023-03-01: 60 mg via INTRAVENOUS

## 2023-03-01 MED ORDER — ONDANSETRON HCL 4 MG/2ML IJ SOLN: 4.0000 mg | Freq: Once | INTRAMUSCULAR | Status: DC | PRN

## 2023-03-01 MED ORDER — ROCURONIUM BROMIDE 10 MG/ML (PF) SYRINGE
PREFILLED_SYRINGE | INTRAVENOUS | Status: AC
  Filled 2023-03-01: qty 10

## 2023-03-01 MED ORDER — DEXAMETHASONE SODIUM PHOSPHATE 10 MG/ML IJ SOLN
INTRAMUSCULAR | Status: AC
  Filled 2023-03-01: qty 3

## 2023-03-01 MED ORDER — SUGAMMADEX SODIUM 200 MG/2ML IV SOLN
INTRAVENOUS | Status: DC | PRN
  Administered 2023-03-01: 300 mg via INTRAVENOUS

## 2023-03-01 MED ORDER — CHLORHEXIDINE GLUCONATE CLOTH 2 % EX PADS
6.0000 | MEDICATED_PAD | Freq: Once | CUTANEOUS | Status: AC
  Administered 2023-03-01: 6 via TOPICAL

## 2023-03-01 MED ORDER — METHYLENE BLUE (ANTIDOTE) 1 % IV SOLN
INTRAVENOUS | Status: DC | PRN
  Administered 2023-03-01: 2 mL via SUBMUCOSAL

## 2023-03-01 MED ORDER — CEFAZOLIN SODIUM-DEXTROSE 2-4 GM/100ML-% IV SOLN
2.0000 g | INTRAVENOUS | Status: AC
  Administered 2023-03-01: 2 g via INTRAVENOUS

## 2023-03-01 SURGICAL SUPPLY — 38 items
BLADE SURG 15 STRL LF DISP TIS (BLADE) ×1 IMPLANT
BLADE SURG 15 STRL SS (BLADE) ×1
BRIEF MESH DISP 2XL (UNDERPADS AND DIAPERS) ×1 IMPLANT
DRAPE LAPAROTOMY 100X77 ABD (DRAPES) ×1 IMPLANT
DRSG GAUZE FLUFF 36X18 (GAUZE/BANDAGES/DRESSINGS) ×1 IMPLANT
ELECT CAUTERY BLADE TIP 2.5 (TIP) ×1
ELECT REM PT RETURN 9FT ADLT (ELECTROSURGICAL) ×1
ELECTRODE CAUTERY BLDE TIP 2.5 (TIP) ×1 IMPLANT
ELECTRODE REM PT RTRN 9FT ADLT (ELECTROSURGICAL) ×1 IMPLANT
GAUZE 4X4 16PLY ~~LOC~~+RFID DBL (SPONGE) ×1 IMPLANT
GAUZE PACKING 0.25INX5YD STRL (GAUZE/BANDAGES/DRESSINGS) ×1 IMPLANT
GAUZE PACKING IODOFORM 1/2INX (GAUZE/BANDAGES/DRESSINGS) IMPLANT
GAUZE SPONGE 4X4 12PLY STRL (GAUZE/BANDAGES/DRESSINGS) ×1 IMPLANT
GAUZE VASELINE 3X9 (GAUZE/BANDAGES/DRESSINGS) ×1 IMPLANT
GAUZE XEROFORM 1X8 LF (GAUZE/BANDAGES/DRESSINGS) IMPLANT
GLOVE ORTHO TXT STRL SZ7.5 (GLOVE) ×2 IMPLANT
GOWN STRL REUS W/ TWL LRG LVL3 (GOWN DISPOSABLE) ×1 IMPLANT
GOWN STRL REUS W/ TWL XL LVL3 (GOWN DISPOSABLE) ×1 IMPLANT
GOWN STRL REUS W/TWL LRG LVL3 (GOWN DISPOSABLE) ×1
GOWN STRL REUS W/TWL XL LVL3 (GOWN DISPOSABLE) ×1
KIT TURNOVER KIT A (KITS) ×1 IMPLANT
MANIFOLD NEPTUNE II (INSTRUMENTS) ×1 IMPLANT
NDL HYPO 22X1.5 SAFETY MO (MISCELLANEOUS) ×2 IMPLANT
NEEDLE HYPO 22X1.5 SAFETY MO (MISCELLANEOUS) ×2
PACK BASIN MINOR ARMC (MISCELLANEOUS) ×1 IMPLANT
SOL PREP PVP 2OZ (MISCELLANEOUS) ×1
SOLUTION PREP PVP 2OZ (MISCELLANEOUS) ×1 IMPLANT
SPONGE VERSALON 4X4 4PLY (MISCELLANEOUS) IMPLANT
SURGILUBE 2OZ TUBE FLIPTOP (MISCELLANEOUS) IMPLANT
SUT CHROMIC 3 0 SH 27 (SUTURE) IMPLANT
SUT PROLENE 2 0 SH DA (SUTURE) IMPLANT
SUT PROLENE 3 0 SH DA (SUTURE) IMPLANT
SWABSTK COMLB BENZOIN TINCTURE (MISCELLANEOUS) ×1 IMPLANT
SYR 10ML LL (SYRINGE) ×1 IMPLANT
SYR 20ML LL LF (SYRINGE) ×2 IMPLANT
TAPE CLOTH 3X10 WHT NS LF (GAUZE/BANDAGES/DRESSINGS) ×1 IMPLANT
TRAP FLUID SMOKE EVACUATOR (MISCELLANEOUS) ×1 IMPLANT
WATER STERILE IRR 500ML POUR (IV SOLUTION) ×1 IMPLANT

## 2023-03-01 NOTE — Op Note (Signed)
Excision of pilonidal cyst, extensive.  Pre-operative Diagnosis: Recurrent chronic pilonidal cyst.  Post-operative Diagnosis: same.    Surgeon: Campbell Lerner, M.D., [facility]  Anesthesia: General Endotracheal  Findings: As anticipated 3+ centimeter area of pilonidal cystic extension involving the deep tissues of the natal cleft region.  Estimated Blood Loss: 5 mL         Specimens: Skin, scar and deep subcutaneous tissues consistent with pilonidal cystic disease.          Complications: none              Procedure Details  The patient was seen again in the Holding Room. The benefits, complications, treatment options, and expected outcomes were discussed with the patient. The risks of bleeding, infection, recurrence of symptoms, failure to resolve symptoms, unanticipated injury, prosthetic placement, prosthetic infection, any of which could require further surgery were reviewed with the patient. The likelihood of improving the patient's symptoms with return to their baseline status is anticipated.  The patient and/or family concurred with the proposed plan, giving informed consent.  The patient was taken to Operating Room, identified and the procedure verified.    Prior to the induction of general anesthesia, antibiotic prophylaxis was administered. VTE prophylaxis was in place.  General anesthesia was then administered and tolerated well. After the induction, the patient was positioned in the prone position and the natal cleft area was prepped with  Chloraprep and draped in the sterile fashion.  A Time Out was held and the above information confirmed.  With a 22-gauge Angiocath we were able to catheterize a residual pilonidal sinus, and we stained the subcutaneous tissues with methylene blue in a one-to-one ratio with hydroperoxide.  The cystic lesions, sinus tracts and roots were operatively excised.  Hemostasis obtained with electrocautery.  Wound was irrigated with saline, hemostasis  confirmed.  Local infiltration with quarter percent Marcaine with epinephrine is completed to the surrounding area for adequate anesthetic effect.  The wound was packed with half-inch wide packing strip, covered with Xeroform, 4 x 4's and ABD pad which was secured with tape. Patient was subsequently turned to supine positioning, subsequently extubated and transferred to PACU in stable condition.      Campbell Lerner M.D., [facility] 03/01/2023 12:53 PM

## 2023-03-01 NOTE — Discharge Instructions (Signed)

## 2023-03-01 NOTE — Anesthesia Preprocedure Evaluation (Signed)
Anesthesia Evaluation  Patient identified by MRN, date of birth, ID band Patient awake    Reviewed: Allergy & Precautions, NPO status , Patient's Chart, lab work & pertinent test results  Airway Mallampati: II  TM Distance: >3 FB Neck ROM: full    Dental  (+) Upper Dentures, Lower Dentures   Pulmonary neg pulmonary ROS, COPD, Patient abstained from smoking., former smoker   Pulmonary exam normal  + decreased breath sounds      Cardiovascular Exercise Tolerance: Good negative cardio ROS Normal cardiovascular exam Rhythm:Regular Rate:Normal     Neuro/Psych negative neurological ROS  negative psych ROS   GI/Hepatic negative GI ROS, Neg liver ROS,GERD  Medicated,,  Endo/Other  negative endocrine ROS    Renal/GU negative Renal ROS     Musculoskeletal   Abdominal Normal abdominal exam  (+)   Peds negative pediatric ROS (+)  Hematology negative hematology ROS (+)   Anesthesia Other Findings Past Medical History: No date: Adenomatous polyp of ascending colon No date: Anginal pain (HCC) No date: Atypical chest pain No date: DDD (degenerative disc disease), lumbar No date: GERD (gastroesophageal reflux disease) No date: High cholesterol No date: History of kidney stones No date: Hyperlipidemia 10/2022: Pilonidal cyst  Past Surgical History: 2000: ABDOMINAL HYSTERECTOMY 01/14/2012: BREAST BIOPSY; Right     Comment:  core/clip neg 05/27/2021: COLONOSCOPY WITH PROPOFOL; N/A     Comment:  Procedure: COLONOSCOPY WITH PROPOFOL;  Surgeon: Toney Reil, MD;  Location: [facility] ENDOSCOPY;  Service:               Gastroenterology;  Laterality: N/A; 11/09/2022: PILONIDAL CYST EXCISION; N/A     Comment:  Procedure: CYST EXCISION;  Surgeon: Campbell Lerner,               MD;  Location: [facility] ORS;  Service: General;  Laterality:               N/A;     Reproductive/Obstetrics negative OB ROS                              Anesthesia Physical Anesthesia Plan  ASA: 2  Anesthesia Plan: General   Post-op Pain Management:    Induction: Intravenous  PONV Risk Score and Plan: 1 and Ondansetron and Dexamethasone  Airway Management Planned: Oral ETT  Additional Equipment:   Intra-op Plan:   Post-operative Plan: Extubation in OR  Informed Consent: I have reviewed the patients History and Physical, chart, labs and discussed the procedure including the risks, benefits and alternatives for the proposed anesthesia with the patient or authorized representative who has indicated his/her understanding and acceptance.     Dental advisory given  Plan Discussed with: CRNA and Surgeon  Anesthesia Plan Comments:        Anesthesia Quick Evaluation

## 2023-03-01 NOTE — Interval H&P Note (Signed)
History and Physical Interval Note:  03/01/2023 10:30 AM  [name]  has presented today for surgery, with the diagnosis of pilonidal cyst.  The various methods of treatment have been discussed with the patient and family. After consideration of risks, benefits and other options for treatment, the patient has consented to  Procedure(s): CYST EXCISION PILONIDAL EXTENSIVE (N/A) as a surgical intervention.  The patient's history has been reviewed, patient examined, no change in status, stable for surgery.  I have reviewed the patient's chart and labs.  Questions were answered to the patient's satisfaction.     Campbell Lerner

## 2023-03-01 NOTE — Anesthesia Postprocedure Evaluation (Signed)
Anesthesia Post Note  Patient: [name]  Procedure(s) Performed: CYST EXCISION PILONIDAL EXTENSIVE (Back)  Patient location during evaluation: PACU Anesthesia Type: General Level of consciousness: awake Pain management: pain level controlled Vital Signs Assessment: post-procedure vital signs reviewed and stable Respiratory status: spontaneous breathing Cardiovascular status: blood pressure returned to baseline Anesthetic complications: no   No notable events documented.   Last Vitals:  Vitals:   03/01/23 1315 03/01/23 1330  BP: 110/72 112/76  Pulse: 79 69  Resp: 18 18  Temp: (!) 36.1 C (!) 36.1 C  SpO2: 98% 98%    Last Pain:  Vitals:   03/01/23 1330  TempSrc:   PainSc: 0-No pain                 VAN STAVEREN,Brayden Betters

## 2023-03-01 NOTE — Anesthesia Procedure Notes (Signed)
Procedure Name: Intubation Date/Time: 03/01/2023 12:11 PM  Performed by: Lanell Matar, CRNAPre-anesthesia Checklist: Patient identified, Emergency Drugs available, Suction available and Patient being monitored Patient Re-evaluated:Patient Re-evaluated prior to induction Oxygen Delivery Method: Circle System Utilized Preoxygenation: Pre-oxygenation with 100% oxygen Induction Type: IV induction Ventilation: Mask ventilation without difficulty Laryngoscope Size: McGraph and 4 Grade View: Grade I Tube type: Oral Tube size: 7.0 mm Number of attempts: 1 Airway Equipment and Method: Stylet and Oral airway Placement Confirmation: ETT inserted through vocal cords under direct vision, positive ETCO2 and breath sounds checked- equal and bilateral Secured at: 22 cm Tube secured with: Tape Dental Injury: Teeth and Oropharynx as per pre-operative assessment

## 2023-03-01 NOTE — Transfer of Care (Signed)
Immediate Anesthesia Transfer of Care Note  Patient: [name]  Procedure(s) Performed: CYST EXCISION PILONIDAL EXTENSIVE (Back)  Patient Location: PACU  Anesthesia Type:General  Level of Consciousness: drowsy and patient cooperative  Airway & Oxygen Therapy: Patient Spontanous Breathing and Patient connected to face mask oxygen  Post-op Assessment: Report given to RN, Post -op Vital signs reviewed and stable, and Patient moving all extremities X 4  Post vital signs: Reviewed and stable  Last Vitals:  Vitals Value Taken Time  BP 111/66 03/01/23 1300  Temp 36.6 C 03/01/23 1300  Pulse 71 03/01/23 1302  Resp 13 03/01/23 1302  SpO2 100 % 03/01/23 1302  Vitals shown include unfiled device data.  Last Pain:  Vitals:   03/01/23 1017  TempSrc: Temporal  PainSc: 0-No pain         Complications: No notable events documented.

## 2023-03-02 ENCOUNTER — Encounter: Payer: Self-pay | Admitting: Surgery

## 2023-03-02 LAB — SURGICAL PATHOLOGY

## 2023-03-09 ENCOUNTER — Telehealth: Payer: 59 | Admitting: Physician Assistant

## 2023-03-09 DIAGNOSIS — R3989 Other symptoms and signs involving the genitourinary system: Secondary | ICD-10-CM | POA: Diagnosis not present

## 2023-03-09 MED ORDER — CEPHALEXIN 500 MG PO CAPS
500.0000 mg | ORAL_CAPSULE | Freq: Two times a day (BID) | ORAL | 0 refills | Status: DC
Start: 2023-03-09 — End: 2023-03-25

## 2023-03-09 NOTE — Progress Notes (Signed)

## 2023-03-13 ENCOUNTER — Telehealth: Payer: 59 | Admitting: Family Medicine

## 2023-03-13 DIAGNOSIS — R3989 Other symptoms and signs involving the genitourinary system: Secondary | ICD-10-CM

## 2023-03-13 NOTE — Progress Notes (Signed)
Because Ms. Hosford, I feel your condition warrants further evaluation and I recommend that you be seen in a face to face visit.   NOTE: There will be NO CHARGE for this eVisit   If you are having a true medical emergency please call 911.      For an urgent face to face visit, [facility] has eight urgent care centers for your convenience:   NEW!! [facility] at [facility] Get Driving Directions [phone] [address], [address] [place], [address]    [facility] at [facility] Get Driving Directions [phone] [address] [place], [place] [address]   [facility] Health Urgent Care Center [facility]) Get Driving Directions [phone] [address] [place], [place] [address]  [facility] Health Urgent Care Center [facility]) Get Driving Directions [phone] [address] [place],  [place]  [address]  [facility] Health Urgent Care Center [facility] - at [employer]  [phone] [id number] [place],  [place] [address]   [facility] Health Urgent Care at [facility] Get Driving Directions [phone] [address] [place], [place] [address]   [facility] Health Urgent Care at [facility] Get Driving Directions  [phone] [address] [place], [place] [address]   [facility] Health Urgent Care at [facility] Directions [phone] [address], [address] [place], [place] [address]  Your MyChart E-visit questionnaire answers were reviewed by a board certified advanced clinical practitioner to complete your personal care plan based on your specific symptoms.  Thank you for using e-Visits.

## 2023-03-16 ENCOUNTER — Encounter: Payer: Self-pay | Admitting: Surgery

## 2023-03-16 ENCOUNTER — Ambulatory Visit (INDEPENDENT_AMBULATORY_CARE_PROVIDER_SITE_OTHER): Payer: 59 | Admitting: Surgery

## 2023-03-16 VITALS — BP 111/71 | HR 70 | Temp 98.2°F | Ht 64.0 in | Wt 159.6 lb

## 2023-03-16 DIAGNOSIS — Z09 Encounter for follow-up examination after completed treatment for conditions other than malignant neoplasm: Secondary | ICD-10-CM

## 2023-03-16 DIAGNOSIS — L0591 Pilonidal cyst without abscess: Secondary | ICD-10-CM

## 2023-03-16 NOTE — Patient Instructions (Signed)
If you have any concerns or questions, please feel free to call our office. See follow up appointment below.   Pilonidal Cyst Removal, Care After The following information offers guidance on how to care for yourself after your procedure. Your health care provider may also give you more specific instructions. If you have problems or questions, contact your health care provider. What can I expect after the procedure? After the procedure, it is common to have: Pain. Redness. Some swelling. Some fluid or blood coming from your incision (drainage). You may have more drainage if you have an open incision. Follow these instructions at home: Medicines Take over-the-counter and prescription medicines only as told by your health care provider. If you were prescribed antibiotics, take them as told by your health care provider. Do not stop using the antibiotic even if you start to feel better. Ask your health care provider if the medicine prescribed to you: Requires you to avoid driving or using machinery. Can cause constipation. You may need to take these actions to prevent or treat constipation: Drink enough fluid to keep your urine pale yellow. Take over-the-counter or prescription medicines. Eat foods that are high in fiber, such as beans, whole grains, and fresh fruits and vegetables. Limit foods that are high in fat and processed sugars, such as fried or sweet foods. Incision care  You may need to have a caregiver help you with wound care and dressing changes. Follow instructions from your health care provider about how to take care of your incision. Make sure you: Wash your hands with soap and water for at least 20 seconds before and after you change your bandage (dressing). If soap and water are not available, use hand sanitizer. Change your dressing as told by your health care provider. Leave stitches (sutures), skin glue, or adhesive strips in place. These skin closures may need to stay in  place for 2 weeks or longer. If adhesive strip edges start to loosen and curl up, you may trim the loose edges. Do not remove adhesive strips completely unless your health care provider tells you to do that. Check your incision area every day for signs of infection. If it is hard to see the area, have someone check for you. Check for: More redness, swelling, or pain. More fluid or blood. Warmth. Pus or a bad smell. Managing pain and swelling If directed, put ice on the affected area. To do this: Put ice in a plastic bag. Place a towel between your skin and the bag. Leave the ice on for 20 minutes, 2-3 times a day. If your skin turns bright red, remove the ice right away to prevent skin damage. The risk of skin damage is higher if you cannot feel pain, heat, or cold. Activity Do not do activities that cause pain or irritate the incision area. These may include bike riding, running, sit ups, or anything that involves a twisting motion. Rest as told by your health care provider. Do not sit for a long time without moving. Get up to take short walks every 1-2 hours. This will improve blood flow and breathing. Ask for help if you feel weak or unsteady. Return to your normal activities as told by your health care provider. Ask your health care provider what activities are safe for you. General instructions Do not use any products that contain nicotine or tobacco. These products include cigarettes, chewing tobacco, and vaping devices, such as e-cigarettes. These can delay healing after surgery. If you need help quitting,  ask your health care provider. Do not take baths, swim, or use a hot tub until your health care provider approves. Ask your health care provider if you may take showers. You may only be allowed to take sponge baths. Keep all follow-up visits. This is important to monitor healing. If you had a procedure with wound packing, your packing may be changed or removed at follow-up  visits. Contact a health care provider if: You have pain that does not get better with medicine. You have any of these signs of infection: More redness, swelling, or pain around your incision. More fluid or blood coming from your incision. Warmth coming from your incision. Pus or a bad smell coming from your incision. A fever. Get help right away if: You have severe pain in your abdomen. You have sudden chest pain and shortness of breath. You cough up blood. You faint or lose consciousness. These symptoms may be an emergency. Get help right away. Call 911. Do not wait to see if the symptoms will go away. Do not drive yourself to the hospital. Summary It is common to have some pain and drainage after your procedure. You may have more drainage if you have an open incision. You may need to have a caregiver help you with wound care and dressing changes. Do not do activities that cause pain or irritate the incision area. Contact your health care provider if you have pain that does not get better with medicine or if you have any signs of infection. This information is not intended to replace advice given to you by your health care provider. Make sure you discuss any questions you have with your health care provider. Document Revised: 08/15/2021 Document Reviewed: 08/15/2021 Elsevier Patient Education  2024 [employer].

## 2023-03-16 NOTE — Progress Notes (Signed)
[facility] POST-OP OFFICE VISIT  03/16/2023  HPI: [name] is a 57 y.o. female 15 days s/p excision of recurrent pilonidal disease.  She reports excessive drainage, expected tenderness.  Was recently seen in the ED for flank pain, but no evidence of renal disease.  Denies any fevers or chills.  Vital signs: BP 111/71 (BP Location: Left Arm, Patient Position: Sitting, Cuff Size: Small)   Pulse 70   Temp 98.2 F (36.8 C) (Oral)   Ht 5\' 4"  (1.626 m)   Wt 159 lb 9.6 oz (72.4 kg)   SpO2 97%   BMI 27.40 kg/m    Physical Exam: Constitutional: She appears well.  Skin: Pilonidal wound, without retraction, it measures 30 mm from craniocaudad, 9 mm from lateral to medial, with its greatest depth at 19 mm.  It appears to be granulating well with about 85% granulation there is a lot of mobility of the deep soft tissues with with no remarkable induration.  Expected thin exudates, no necrosis, no malodor and no tunneling or significant undermining.  Assessment/Plan: This is a 57 y.o. female 15 days s/p excision of recurrent chronic pilonidal disease.  Patient Active Problem List   Diagnosis Date Noted   Chronic recurrent pilonidal cyst 11/09/2022   Positive colorectal cancer screening using Cologuard test    Adenomatous polyp of ascending colon    Elevated hemoglobin A1c 08/03/2017   Chronic right-sided low back pain with right-sided sciatica 07/26/2017   DDD (degenerative disc disease), lumbar 07/26/2017   Lipoma of chest wall 05/27/2017   Hyperlipidemia 04/09/2016   Adjustment disorder 04/09/2016   Nicotine dependence 04/09/2016   Colon cancer screening 04/09/2016   Lump or mass in breast 03/08/2013    -We discussed at length the possibility of transitioning over to negative pressure wound therapy.  She would like to consider this option to expedite her healing process.  We discussed the pros and cons of adding it to her regimen, and she would like to pursue  it.   Campbell Lerner M.D., FACS 03/16/2023, 1:34 PM

## 2023-03-17 ENCOUNTER — Ambulatory Visit: Payer: Self-pay | Admitting: *Deleted

## 2023-03-17 ENCOUNTER — Telehealth: Payer: Self-pay | Admitting: Surgery

## 2023-03-17 NOTE — Telephone Encounter (Signed)
  Chief Complaint: back and abdominal pain Symptoms: symptoms occur when sitting- not present when resting Frequency: 5 days Pertinent Negatives: Patient denies fever, urinary symptoms Disposition: [] ED /[] Urgent Care (no appt availability in office) / [x] Appointment(In office/virtual)/ []  [facility]/ [] Home Care/ [] Refused Recommended Disposition /[] [facility]/ []  Follow-up with PCP Additional Notes: Patient has recently had cyst removed 10/7 and states she is waiting for negative pressure wound treatment to help healing process. Patient is complaining of back pain- mid to lower pain accompanied by abdominal pain when she is sitting only. Patient was seen by surgeon yesterday and does have follow up- she is requesting appointment with PCP . Patient has been scheduled to see PCP and has been advised to closely monitor her symptoms and temperature- call office with changes.

## 2023-03-17 NOTE — Telephone Encounter (Signed)
Reason for Disposition  [1] MODERATE back pain (e.g., interferes with normal activities) AND [2] present > 3 days  Answer Assessment - Initial Assessment Questions 1. ONSET: "When did the pain begin?"      Post op surgery 03/01/23 -lower back pain 5 days ago 2. LOCATION: "Where does it hurt?" (upper, mid or lower back)     Lower back 3. SEVERITY: "How bad is the pain?"  (e.g., Scale 1-10; mild, moderate, or severe)   - MILD (1-3): Doesn't interfere with normal activities.    - MODERATE (4-7): Interferes with normal activities or awakens from sleep.    - SEVERE (8-10): Excruciating pain, unable to do any normal activities.      Moderate 7/10 4. PATTERN: "Is the pain constant?" (e.g., yes, no; constant, intermittent)      Comes and goes- not present when resting 5. RADIATION: "Does the pain shoot into your legs or somewhere else?"     abdomen 6. CAUSE:  "What do you think is causing the back pain?"      Unsure- recent treatment for UTI - finished antibiotic today 7. BACK OVERUSE:  "Any recent lifting of heavy objects, strenuous work or exercise?"     May be moving differently due to surgeon  8. MEDICINES: "What have you taken so far for the pain?" (e.g., nothing, acetaminophen, NSAIDS)     Not using pain medications during the day 9. NEUROLOGIC SYMPTOMS: "Do you have any weakness, numbness, or problems with bowel/bladder control?"     no 10. OTHER SYMPTOMS: "Do you have any other symptoms?" (e.g., fever, abdomen pain, burning with urination, blood in urine)       Abdominal pain when back hurt- only sitting  Protocols used: Back Pain-A-AH

## 2023-03-17 NOTE — Telephone Encounter (Signed)
[name] with [facility] supply called. She said that they are out of network for the medical supplies we are requesting on this patient.  York Spaniel you can try at [employer] @ [phone].  Thank you.

## 2023-03-18 ENCOUNTER — Ambulatory Visit
Admission: RE | Admit: 2023-03-18 | Discharge: 2023-03-18 | Disposition: A | Discharge status: Home / Self Care | Payer: 59 | Source: Ambulatory Visit | Attending: Family Medicine | Admitting: Family Medicine

## 2023-03-18 DIAGNOSIS — Z1231 Encounter for screening mammogram for malignant neoplasm of breast: Secondary | ICD-10-CM | POA: Diagnosis present

## 2023-03-19 ENCOUNTER — Ambulatory Visit (INDEPENDENT_AMBULATORY_CARE_PROVIDER_SITE_OTHER): Payer: 59 | Admitting: Family Medicine

## 2023-03-19 ENCOUNTER — Encounter: Payer: Self-pay | Admitting: Family Medicine

## 2023-03-19 VITALS — BP 106/72 | HR 74 | Temp 98.0°F | Ht 64.0 in | Wt 160.0 lb

## 2023-03-19 DIAGNOSIS — G8918 Other acute postprocedural pain: Secondary | ICD-10-CM

## 2023-03-19 DIAGNOSIS — L0591 Pilonidal cyst without abscess: Secondary | ICD-10-CM

## 2023-03-19 DIAGNOSIS — M545 Low back pain, unspecified: Secondary | ICD-10-CM | POA: Diagnosis not present

## 2023-03-19 DIAGNOSIS — R350 Frequency of micturition: Secondary | ICD-10-CM

## 2023-03-19 DIAGNOSIS — M62830 Muscle spasm of back: Secondary | ICD-10-CM | POA: Diagnosis not present

## 2023-03-19 LAB — POCT URINALYSIS DIPSTICK
Bilirubin, UA: NEGATIVE
Blood, UA: NEGATIVE
Glucose, UA: NEGATIVE
Ketones, UA: NEGATIVE
Leukocytes, UA: NEGATIVE
Nitrite, UA: NEGATIVE
Protein, UA: NEGATIVE
Spec Grav, UA: 1.015 (ref 1.010–1.025)
Urobilinogen, UA: 0.2 U/dL
pH, UA: 5 (ref 5.0–8.0)

## 2023-03-19 MED ORDER — HYDROCODONE-ACETAMINOPHEN 5-325 MG PO TABS: 1.0000 | ORAL_TABLET | Freq: Four times a day (QID) | ORAL | 0 refills | Status: DC | PRN

## 2023-03-19 MED ORDER — CYCLOBENZAPRINE HCL 10 MG PO TABS
10.0000 mg | ORAL_TABLET | Freq: Three times a day (TID) | ORAL | 2 refills | Status: DC | PRN
Start: 2023-03-19 — End: 2024-03-14

## 2023-03-19 NOTE — Patient Instructions (Addendum)
Thank you for coming to the office today.    Please schedule a Follow-up Appointment to: No follow-ups on file.  If you have any other questions or concerns, please feel free to call the office or send a message through MyChart. You may also schedule an earlier appointment if necessary.  Additionally, you may be receiving a survey about your experience at our office within a few days to 1 week by e-mail or mail. We value your feedback.  Naira Standiford, DO [facility] 

## 2023-03-19 NOTE — Progress Notes (Signed)
Subjective:    Patient ID: [name], female    DOB: [birth year], 57 y.o.   MRN: [id number]  [name] is a 57 y.o. female presenting on 03/19/2023 for Back Pain (Pain lower back pain radiated abdominal- 4 days after surgery. Denied fever and urgent care prescribe antibiotic last taken yesterday)   HPI  Discussed the use of AI scribe software for clinical note transcription with the patient, who gave verbal consent to proceed.  Pilonidal Cyst Initial surgery 10/2022 Repeat surgery revision since it returned 10/7     The patient, with a history of recurrent Pilonidal cyst, underwent a second surgical intervention on October 7th due to cyst recurrence. Postoperatively, she experienced symptoms suggestive of a urinary tract infection (UTI), prompting an E-visit and a course of Keflex, which was completed without resolution of symptoms. The patient also reported significant wound drainage, necessitating dressing changes three times daily.  The patient visited the ER on October 24th due to persistent abdominal and back pain. She received a pain shot, which provided temporary relief. The patient noted a change in the color of the wound discharge, which she suspected might be related to her abdominal and back pain. The pain is more pronounced when sitting and alleviated when lying down.  The patient also reported lower back and abdominal pain, which she described as different from the UTI pain. The pain is bilateral and located in the lower back and lower abdomen, resembling pelvic pain. The pain is position-dependent, being constant when sitting and absent when lying on the side.  The patient has been managing her postoperative pain with hydrocodone, primarily taken at night due to work commitments. She reported having only two pills left and expressed a need for more to manage her pain until the wound vac is approved and implemented. She also reported experiencing cramping, more pronounced  in the back, which she believes might be related to the surgical procedure.            03/19/2023    1:37 PM 09/14/2022    3:37 PM 02/26/2022   10:58 AM  Depression screen PHQ 2/9  Decreased Interest 0 0 0  Down, Depressed, Hopeless 0 0 0  PHQ - 2 Score 0 0 0  Altered sleeping 0  1  Tired, decreased energy 0  1  Change in appetite 0  3  Feeling bad or failure about yourself  0  0  Trouble concentrating 0  0  Moving slowly or fidgety/restless 0  0  Suicidal thoughts 0  0  PHQ-9 Score 0  5  Difficult doing work/chores Not difficult at all  Not difficult at all    Social History   Tobacco Use   Smoking status: Former    Current packs/day: 0.00    Average packs/day: 1 pack/day for 30.0 years (30.0 ttl pk-yrs)    Types: Cigarettes, E-cigarettes    Start date: 06/25/1990    Quit date: 06/25/2020    Years since quitting: 2.7    Passive exposure: Past   Smokeless tobacco: Never  Vaping Use   Vaping status: Some Days   Substances: Nicotine  Substance Use Topics   Alcohol use: Yes    Comment: occassional   Drug use: Not Currently    Types: Cocaine    Comment: 6 years ago (2018)    Review of Systems Per HPI unless specifically indicated above     Objective:    BP 106/72 (BP Location: Right Arm, Patient  Position: Sitting, Cuff Size: Normal)   Pulse 74   Temp 98 F (36.7 C)   Ht 5\' 4"  (1.626 m)   Wt 160 lb (72.6 kg)   SpO2 98%   BMI 27.46 kg/m   Wt Readings from Last 3 Encounters:  03/19/23 160 lb (72.6 kg)  03/16/23 159 lb 9.6 oz (72.4 kg)  02/18/23 156 lb (70.8 kg)    Physical Exam Vitals and nursing note reviewed.  Constitutional:      General: She is not in acute distress.    Appearance: She is well-developed. She is not diaphoretic.     Comments: Well-appearing, comfortable, cooperative  HENT:     Head: Normocephalic and atraumatic.  Eyes:     General:        Right eye: No discharge.        Left eye: No discharge.     Conjunctiva/sclera: Conjunctivae  normal.  Neck:     Thyroid: No thyromegaly.  Cardiovascular:     Rate and Rhythm: Normal rate and regular rhythm.     Heart sounds: Normal heart sounds. No murmur heard. Pulmonary:     Effort: Pulmonary effort is normal. No respiratory distress.     Breath sounds: Normal breath sounds. No wheezing or rales.  Musculoskeletal:        General: Normal range of motion.     Cervical back: Normal range of motion and neck supple.  Lymphadenopathy:     Cervical: No cervical adenopathy.  Skin:    General: Skin is warm and dry.     Findings: No erythema or rash.  Neurological:     Mental Status: She is alert and oriented to person, place, and time.  Psychiatric:        Behavior: Behavior normal.     Comments: Well groomed, good eye contact, normal speech and thoughts    Results for orders placed or performed in visit on 03/19/23  POCT Urinalysis Dipstick  Result Value Ref Range   Color, UA yellow    Clarity, UA clear    Glucose, UA Negative Negative   Bilirubin, UA neg    Ketones, UA neg    Spec Grav, UA 1.015 1.010 - 1.025   Blood, UA neg    pH, UA 5.0 5.0 - 8.0   Protein, UA Negative Negative   Urobilinogen, UA negative (A) 0.2 or 1.0 E.U./dL   Nitrite, UA neg    Leukocytes, UA Negative Negative   Appearance     Odor        Assessment & Plan:   Problem List Items Addressed This Visit   None Visit Diagnoses     Pilonidal cyst    -  Primary   Urinary frequency       Relevant Orders   POCT Urinalysis Dipstick (Completed)   Acute bilateral low back pain without sciatica       Relevant Medications   cyclobenzaprine (FLEXERIL) 10 MG tablet   HYDROcodone-acetaminophen (NORCO/VICODIN) 5-325 MG tablet   Back spasm       Relevant Medications   cyclobenzaprine (FLEXERIL) 10 MG tablet   Post-operative pain       Relevant Medications   HYDROcodone-acetaminophen (NORCO/VICODIN) 5-325 MG tablet       Assessment and Plan    Postoperative wound complication From pilonidal  cyst Followed by [facility] Dr Claudine Mouton Persistent wound drainage and pain following two surgeries for a cyst, most recently on October 7th. Yellow discharge noted, likely granulation tissue rather than  pus. Awaiting approval for wound vac to expedite healing. -Renew Hydrocodone prescription for pain management, 15 pills. -Prescribe Flexeril for muscle spasms, 30 pills with refills as needed. -Continue monitoring wound and await wound vac approval.  Urinary symptoms - resolved Recent symptoms suggestive of UTI resolved following a course of Keflex. Current abdominal and back pain not believed to be related to UTI. -No further antibiotics prescribed at this time. -Continue monitoring symptoms.  General Health Maintenance -Recent mammogram performed, awaiting results.        Orders Placed This Encounter  Procedures   POCT Urinalysis Dipstick     Meds ordered this encounter  Medications   cyclobenzaprine (FLEXERIL) 10 MG tablet    Sig: Take 1 tablet (10 mg total) by mouth 3 (three) times daily as needed for muscle spasms.    Dispense:  30 tablet    Refill:  2   HYDROcodone-acetaminophen (NORCO/VICODIN) 5-325 MG tablet    Sig: Take 1 tablet by mouth every 6 (six) hours as needed for moderate pain (pain score 4-6).    Dispense:  15 tablet    Refill:  0      Follow up plan: Return if symptoms worsen or fail to improve.  Saralyn Pilar, DO [facility] 03/19/2023, 1:43 PM

## 2023-03-22 ENCOUNTER — Encounter: Payer: Self-pay | Admitting: Family Medicine

## 2023-03-23 ENCOUNTER — Encounter: Payer: 59 | Admitting: Surgery

## 2023-03-25 ENCOUNTER — Ambulatory Visit (INDEPENDENT_AMBULATORY_CARE_PROVIDER_SITE_OTHER): Payer: 59 | Admitting: Surgery

## 2023-03-25 ENCOUNTER — Encounter: Payer: Self-pay | Admitting: Surgery

## 2023-03-25 VITALS — BP 113/70 | HR 67 | Temp 98.3°F | Ht 64.0 in | Wt 156.6 lb

## 2023-03-25 DIAGNOSIS — L7682 Other postprocedural complications of skin and subcutaneous tissue: Secondary | ICD-10-CM | POA: Diagnosis not present

## 2023-03-25 DIAGNOSIS — L0591 Pilonidal cyst without abscess: Secondary | ICD-10-CM

## 2023-03-25 DIAGNOSIS — T8189XD Other complications of procedures, not elsewhere classified, subsequent encounter: Secondary | ICD-10-CM

## 2023-03-25 NOTE — Patient Instructions (Signed)
If you have any concerns or questions, please feel free to call our office. See follow up appointment.   Negative Pressure Wound Therapy Home Guide Negative pressure wound therapy (NPWT) uses a sponge or foam-like material (dressing) placed on or inside the wound. The wound is then covered and sealed with a cover dressing that sticks to your skin (is adhesive) to keep air out. A tube is attached to the cover dressing, and this tube connects to a small pump. The pump removes drainage from the wound. NPWT helps to increase blood flow to the wound and heal it from the inside. NPWT also helps pull the edges of the wound together and removes fluids and germs from the wound. NPWT may also be called wound vac. What are the risks? NPWT is usually safe to use. However, problems can occur, including: Skin irritation from the dressing adhesive. Bleeding. Infection. Signs of infection include: More redness, swelling, or pain. More fluid or blood. Warmth or hardness around the wound. Pus or a bad smell. Red streaks leading from the wound. Dehydration. Wounds with large amounts of drainage can cause excessive body fluid loss. Pain. Supplies needed: Wound cleanser or salt-water solution (saline). Skin protectant. This may be a wipe, film, or spray. Clean or germ-free (sterile) scissors. New sponge or foam. Cover dressing. Gauze pad. Tape. Also have available: Soap and water, or hand sanitizer. Disposable gloves. Eye protection. A disposable garbage bag. Protective clothing. How to change your dressing Change the dressing as scheduled, if the dressing loses suction, or the pump is off for 2 hours or more. Prepare to change your dressing  Take pain medicine 30 minutes before changing the dressing if told by your health care provider. Wash your hands with soap and water for at least 20 seconds before you change the dressing. If soap and water are not available, use hand sanitizer Dry your hands  with a clean towel. Set up a clean station for wound care and set a plastic bag on or near your work surface for trash. Open the dressing package so that the sponge dressing remains on the inside of the package. Wear gloves, protective clothing, and eye protection. Remove the old dressing  Turn off the pump and disconnect the tubing from the dressing. Carefully remove the adhesive cover dressing in the direction of your hair growth. Remove the sponge dressing that is inside the wound. If the sponge sticks, use a wound cleanser or saline solution to wet the sponge and help it come off more easily. Throw the old sponge and cover dressing supplies into the garbage bag. Check the wound for signs of infection, including a bad smell, drainage (bleeding or pus), or new color changes (black, grey, yellow, or white) in the wound. Remove your gloves by grabbing the cuff and turning the glove inside out. Place the gloves in the trash immediately. Wash your hands with soap and water for at least 20 seconds. If soap and water are not available, use hand sanitizer. Dry your hands with a clean towel. Clean your wound Wear gloves, protective clothing, and eye protection. Follow your health care provider's instructions on how to clean your wound. You may be told to: Clean the wound using a saline solution or a wound cleanser and a clean gauze pad. Pat the wound dry with a gauze pad. Do not rub the wound. Throw the gauze pad into the garbage bag. Remove your gloves by grabbing the cuff and turning the glove inside out. Place the  gloves in the trash immediately. Wash your hands with soap and water for at least 20 seconds. If soap and water are not available, use hand sanitizer Dry your hands with a clean towel. Apply the new dressing Wear gloves, protective clothing, and eye protection. If told by your health care provider, apply a skin protectant to any skin that will be exposed to adhesive. Let the skin  protectant dry. Cut a piece of new sponge dressing and put it on or in the wound. Using clean scissors, cut a nickel-sized hole in the new cover dressing. Apply the cover dressing. Attach the suction tube over the hole in the cover dressing. Take off your gloves. Put them in the plastic bag with the old dressing. Tie the bag shut and throw it away. Wash your hands with soap and water for at least 20 seconds. If soap and water are not available, use hand sanitizer. Dry your hands with a clean towel. Turn the pump back on. The sponge dressing should collapse. Do not change the settings on the machine without talking to a health care provider. Replace the container in the pump that collects fluid if it is full. Replace the container per the manufacturer's instructions or at least once a week, even if it is not full. Follow your health care provider'sinstructions on how often you need to change and apply the new NPWT dressing. General tips and recommendations If the alarm sounds: Stay calm and prepare to troubleshoot. Do not turn off the pump or do anything with the dressing. The alarm may go off for many reasons, including: The battery is low. Change the battery or plug the device into electrical power. The dressing has a leak. Find the leak and put tape over the leak. The fluid collection container is full. Change the fluid container. Call your health care provider right away if you cannot fix the problem. Explain to your health care provider what is happening. Follow his or her instructions. General instructions Change the dressing as scheduled, if the dressing loses suction, or the pump is off for 2 hours or more. Do not turn off the pump unless told to do so by your health care provider. Do not turn off the pump for more than 2 hours. If the pump is off or you lose suction to the dressing for more than 2 hours, the dressing will need to be changed. Check the machine frequently to make sure  that therapy is on and that all clamps are open. Do not use over-the-counter medicated or antiseptic creams, sprays, liquids, or dressings unless told by your health care provider. Do not take baths, swim, or use a hot tub until your health care provider approves. You may only be allowed to take sponge baths. Ask your health care provider if you may take showers. If your health care provider says it is okay to shower: Do not take the pump into the shower. Make sure the wound dressing is protected and sealed. The wound dressing must stay dry. Contact a health care provider if: You have new pain. You develop irritation, a rash, or itching around the wound or dressing. You see new color changes (black, grey, yellow, or white) in the wound. The dressing changes are painful or cause bleeding. The pump has been off for more than 2 hours, and you do not know how to change the dressing. The pump alarm goes off, and you do not know what to do. Get help right away if: You  have a lot of bleeding. The wound breaks open. You have severe pain. You have signs of infection, such as: More redness, swelling, or pain. More fluid or blood. Warmth or hardness. Pus or a bad smell. Red streaks leading from the wound. A fever. You see a sudden change in the color or texture of the drainage. You have signs of dehydration, such as: Little or no tears, urine, or sweat. Muscle cramps. Very dry mouth. Headache. Dizziness or confusion. Summary NPWT uses a sponge or dressing placed on or inside the wound. NPWT helps to increase blood flow to the wound and heal it from the inside. Follow your health care provider's instructions on how to clean your wound and how to change the dressing. Contact a health care provider if you have new pain, an irritation, or a rash, or if the alarm goes off and you do not know what to do. Get help right away if you have a lot of bleeding, your wound breaks open, or you have severe  pain. Also, get help if you have signs of infection. This information is not intended to replace advice given to you by your health care provider. Make sure you discuss any questions you have with your health care provider. Document Revised: 10/07/2020 Document Reviewed: 10/07/2020 Elsevier Patient Education  2024 [employer].

## 2023-03-26 NOTE — Progress Notes (Signed)
Negative pressure wound therapy application.  Post surgical wound, with no significant change since her last visit in size and depth.  There may be a slight amount of improved epidermal laxation at the very perimeter.  Pilonidal wound, without retraction of perimeter, it measures 30 mm from craniocaudad, 9 mm from lateral to medial, with its greatest depth at 19 mm.  It appears to be granulating well with about 95% granulation there is a lot of mobility of the deep soft tissues with with no remarkable induration.  Expected thin exudates, no necrosis, no malodor and no tunneling or significant undermining.  Still quite tender on palpation.  The old dressing was removed with a fair amount of exudative drainage on the dressing.  The Katrinka Blazing and nephew negative pressure gray foam sponge was utilized, cut to the shape of the wound and placed within the wound ensuring its entirety being deep to the skin edges.  The wound was then sealed with the accompanying adhesive drapes.  A small hole was made for the suction port and the suction port was applied.  The tubing was tracked over the patient's right hip anteriorly. The device was activated and there was no evidence of leak and pressures were set at 125 mmHg.  Patient tolerated procedure well.  Patient's daughter was present for instruction and I believe will be capable of managing sponge changes on a 3 time weekly basis.  Will have her follow-up in 1 week to ensure that there are no new issues or unanticipated problems with its application.  I advise she may shower between dressing changes, she may shower with the dressing in place with care to keep the device free from immersion.

## 2023-03-31 ENCOUNTER — Telehealth: Payer: Self-pay | Admitting: Surgery

## 2023-03-31 NOTE — Telephone Encounter (Signed)
Received call from [place] with [employer]  Her number is [phone] and  Fax # [phone] and they need a note stating that the Renasys wound vac was placed on this patient, the date it was placed and the doctor for whom it was placed by.  Please fax to her when ready.  If questions, can call her at the number above. Thank you.

## 2023-04-01 ENCOUNTER — Encounter: Payer: 59 | Admitting: Surgery

## 2023-04-01 ENCOUNTER — Telehealth: Payer: Self-pay | Admitting: Surgery

## 2023-04-01 NOTE — Telephone Encounter (Signed)
Received call from [facility] with [facility].  They need for note that was sent regarding the wound vac to now also include how long the patient's wound was there before the wound vac was placed and if there were any other wound therapy tried and if so if that therapy failed prior to wound vac being placed.

## 2023-04-01 NOTE — Telephone Encounter (Signed)
Letter has been faxed.

## 2023-04-01 NOTE — Telephone Encounter (Signed)
Faxed office visit notes and operative report pertaining to this.

## 2023-04-06 ENCOUNTER — Ambulatory Visit (INDEPENDENT_AMBULATORY_CARE_PROVIDER_SITE_OTHER): Payer: 59 | Admitting: Surgery

## 2023-04-06 ENCOUNTER — Encounter: Payer: Self-pay | Admitting: Surgery

## 2023-04-06 VITALS — BP 105/67 | HR 96 | Temp 98.4°F | Ht 64.0 in | Wt 159.0 lb

## 2023-04-06 DIAGNOSIS — Z09 Encounter for follow-up examination after completed treatment for conditions other than malignant neoplasm: Secondary | ICD-10-CM

## 2023-04-06 DIAGNOSIS — L0591 Pilonidal cyst without abscess: Secondary | ICD-10-CM

## 2023-04-06 NOTE — Progress Notes (Signed)
[facility] POST-OP OFFICE VISIT  04/06/2023  HPI: [name] is a 57 y.o. female s/p excision of pilonidal recurrent disease. Currently utilizing NPWT with renasys.  Wants to d/c NPWT for multiple reasons.  No issues with pain.  Annoyed by inconveniences, and now with insurance hassels.   Vital signs: BP 105/67 (BP Location: Left Arm, Patient Position: Sitting, Cuff Size: Small)   Pulse 96   Temp 98.4 F (36.9 C) (Oral)   Ht 5\' 4"  (1.626 m)   Wt 159 lb (72.1 kg)   SpO2 96%   BMI 27.29 kg/m    Physical Exam: Constitutional: Appears well.   Skin: pilonidal wound well granulated, and volume of cavity significantly diminished. Good progress.   Assessment/Plan: This is a 57 y.o. female  s/p excision of recurrent pilonidal disease.   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  Consider hydrogel for simplicity.  F/u 6 weeks.    Campbell Lerner M.D., FACS 04/06/2023, 1:34 PM

## 2023-04-06 NOTE — Patient Instructions (Signed)
If you have any concerns or questions, please feel free to call our office.   Wound Packing Wound packing usually involves placing a moistened packing material into your wound and then covering it with an outer bandage (dressing). This helps support the healing of deep tissue and tissue under the skin. It also helps prevent bleeding, infection, and further injury. Wounds are packed until deep tissues heal. The time it takes for this to happen is different for everyone. Your health care provider will show you how to pack and dress your wound. Using gloves and a clean technique is important to avoid spreading germs into your wound. Supplies needed: Soap and water. Disposable gloves. Cleansing or wetting solution, such as saline, germ-free (sterile) water, or an antiseptic solution. Clean bowl. Clean packing material, such as gauze, gauze sponges, or rolled gauze. Clean paper towels. Outer dressing. This includes the cover dressing and tape, or a dressing with an adhesive border. Cotton-tipped swabs. Small plastic bag for trash. How to pack your wound Follow your health care provider's instructions on how often you need to change dressings and pack your wound. You will likely be asked to change your dressings 1 to 2 times a day. Preparing to change the wound packing If needed, take pain medicine 30 minutes before you pack your wound as told by your health care provider. Preparing the new packing material  Clean and disinfect your work surface or countertop. Set a plastic bag on or near your work surface. Wash your hands with soap and water for at least 20 seconds before you change the dressing. If soap and water are not available, use hand sanitizer. Put a clean paper towel on the counter. Put a clean bowl on the towel. Only touch the outside of the bowl when handling it. Pour the cleansing or wetting solution that your health care provider tells you to use into the bowl. Select and cut your  packing material to fit the size of your wound. Avoid using multiple pieces of packing material. Drop it into the bowl. Cut tape strips that you will use to seal the outer dressing, if needed. Put gauze pads for cleansing and cotton-tipped swabs on the clean paper towel. Removing the old packing material and dressing Put on a set of gloves. Gently remove the old dressing and packing material. Make sure to check how the drainage looks or if there is any odor. Clean or rinse (irrigate) the wound. Remove your gloves. Put the removed items, including gloves, into the plastic bag to throw away later. Wash your hands again with soap and water for at least 20 seconds. If soap and water are not available, use hand sanitizer. Applying the new packing material and dressing  Put on a new set of gloves. Squeeze the packing material in the bowl to release the extra liquid. The packing material should be moist, but not dripping wet. Gently place the packing material into the wound. Use a cotton-tipped swab to guide it into place, filling all of the space. Do not overpack the wound bed. Dry your gloved fingertips on the paper towel. Open up your outer dressing supplies and put them on a dry part of the paper towel. Keep them from getting wet. Place the outer dressing over the packed wound. Tape the edges of the outer dressing in place. Remove your gloves. Wash your hands again with soap and water for at least 20 seconds. If soap and water are not available, use hand sanitizer. Put the  removed items, including gloves, into the plastic bag to throw away. Clean and disinfect your work surface or countertop. General tips Follow your health care provider's instructions on how much to pack the wound. At first, you may need to pack it more fully to help stop bleeding. As the wound begins to heal inside, you will use less packing material and pack the wound loosely to allow the tissue to heal slowly from the inside  out. Do not take baths, swim, or use a hot tub until your health care provider approves. Ask your health care provider if you may take showers. You may only be allowed to take sponge baths. Keep the dressing clean and dry. Follow any other instructions given by your health care provider on how to aid healing. This may include applying warm or cold compresses, raising (elevating) the affected area, or wearing a compression dressing. Check your wound site every day for signs of infection. Check for: More redness, swelling, or pain. More fluid or blood. Warmth or hardness (induration). Pus or a bad smell. Protect your wound from the sun when you are outside for the first 6 months, or for as long as told by your health care provider. Cover up the scar area or apply sunscreen that has an SPF of at least 30. Keep all follow-up visits. This is important. Contact a health care provider if: Your pain is not controlled with pain medicine. You have more drainage, redness, swelling, or pain at your wound site. You have new rash, warmth, or induration around the wound. You have a fever or chills. Your wound becomes larger or deeper. Get help right away if: The tissue inside your wound changes color from pink to white, yellow, or black. You notice a bad smell or pus coming from the wound site. You are having trouble packing your wound. Your wound is bleeding, and the bleeding does not stop with gentle pressure. These symptoms may represent a serious problem that is an emergency. Do not wait to see if the symptoms will go away. Get medical help right away. Call your local emergency services (911 in the [place] Do not drive yourself to the hospital. Summary Wound packing usually involves placing a moistened packing material into your wound and then covering it with an outer bandage (dressing). Follow your health care provider's instructions on how often you need to change dressings and pack your wound. You  will likely be asked to change dressings 1 to 2 times a day. When packing your wound, it is important to use gloves to avoid spreading germs into the wound. Check your wound site every day for signs of infection. This information is not intended to replace advice given to you by your health care provider. Make sure you discuss any questions you have with your health care provider. Document Revised: 09/17/2020 Document Reviewed: 09/17/2020 Elsevier Patient Education  2024 [employer].

## 2023-04-23 ENCOUNTER — Other Ambulatory Visit: Payer: Self-pay | Admitting: Family Medicine

## 2023-04-23 DIAGNOSIS — K219 Gastro-esophageal reflux disease without esophagitis: Secondary | ICD-10-CM

## 2023-04-23 DIAGNOSIS — R1013 Epigastric pain: Secondary | ICD-10-CM

## 2023-04-27 NOTE — Telephone Encounter (Signed)
Requested Prescriptions  Pending Prescriptions Disp Refills   pantoprazole (PROTONIX) 40 MG tablet [Pharmacy Med Name: Pantoprazole Sodium 40 MG Oral Tablet Delayed Release] 90 tablet 1    Sig: TAKE 1 TABLET BY MOUTH ONCE DAILY BEFORE BREAKFAST     Gastroenterology: Proton Pump Inhibitors Passed - 04/23/2023 11:39 AM      Passed - Valid encounter within last 12 months    Recent Outpatient Visits           1 month ago Pilonidal cyst   [place] [facility] Smitty Cords, DO   6 months ago Pilonidal cyst   [place] [facility] Smitty Cords, DO   7 months ago Annual physical exam   [place] [facility] Smitty Cords, DO   1 year ago Epigastric pain   [place] [facility] [place], Netta Neat, DO   2 years ago Elevated hemoglobin A1c   [place] [facility] Smitty Cords, DO       Future Appointments             In 4 months Althea Charon, Netta Neat, DO [place] [facility], [facility]

## 2023-09-15 ENCOUNTER — Encounter: Payer: Self-pay | Admitting: Family Medicine

## 2023-10-21 ENCOUNTER — Other Ambulatory Visit: Payer: Self-pay | Admitting: Family Medicine

## 2023-10-21 DIAGNOSIS — K219 Gastro-esophageal reflux disease without esophagitis: Secondary | ICD-10-CM

## 2023-10-21 DIAGNOSIS — R1013 Epigastric pain: Secondary | ICD-10-CM

## 2023-10-22 NOTE — Telephone Encounter (Signed)
 Requested Prescriptions  Pending Prescriptions Disp Refills   pantoprazole  (PROTONIX ) 40 MG tablet [Pharmacy Med Name: Pantoprazole  Sodium 40 MG Oral Tablet Delayed Release] 90 tablet 0    Sig: TAKE 1 TABLET BY MOUTH ONCE DAILY BEFORE BREAKFAST     Gastroenterology: Proton Pump Inhibitors Failed - 10/22/2023  5:11 PM      Failed - Valid encounter within last 12 months    Recent Outpatient Visits   None

## 2023-11-12 ENCOUNTER — Telehealth

## 2023-11-12 DIAGNOSIS — B070 Plantar wart: Secondary | ICD-10-CM | POA: Diagnosis not present

## 2023-11-12 MED ORDER — SALICYLIC ACID WART REMOVER 27.5 % EX LIQD
CUTANEOUS | 0 refills | Status: DC
Start: 2023-11-12 — End: 2024-03-14

## 2023-11-12 NOTE — Patient Instructions (Signed)
 Margueritte Shilling, thank you for joining Angelia Kelp, PA-C for today's virtual visit.  While this provider is not your primary care provider (PCP), if your PCP is located in our provider database this encounter information will be shared with them immediately following your visit.   A [facility] MyChart account gives you access to today's visit and all your visits, tests, and labs performed at [facility]  click here if you don't have a [facility] MyChart account or go to [email or web address]  Consent: (Patient) [name] provided verbal consent for this virtual visit at the beginning of the encounter.  Current Medications:  Current Outpatient Medications:    Salicylic Acid (SALICYLIC ACID WART REMOVER) 27.5 % LIQD, Soak warts for 5 minutes in warm water, can file away dead layers if needed following soak, then apply a thin layer of medicine to cover the entire wart, try to avoid healthy skin surrounding. Let dry for 5 minutes then can cover if needed. Repeat twice daily for up to 12 weeks, Disp: 10 mL, Rfl: 0   aspirin 81 MG tablet, Take 81 mg by mouth daily., Disp: , Rfl:    cyclobenzaprine  (FLEXERIL ) 10 MG tablet, Take 1 tablet (10 mg total) by mouth 3 (three) times daily as needed for muscle spasms., Disp: 30 tablet, Rfl: 2   HYDROcodone -acetaminophen  (NORCO/VICODIN) 5-325 MG tablet, Take 1 tablet by mouth every 6 (six) hours as needed for moderate pain (pain score 4-6)., Disp: 15 tablet, Rfl: 0   ibuprofen  (ADVIL ) 800 MG tablet, Take 1 tablet (800 mg total) by mouth every 8 (eight) hours as needed., Disp: 30 tablet, Rfl: 0   loratadine (CLARITIN) 10 MG tablet, Take 10 mg by mouth daily., Disp: , Rfl:    nitroGLYCERIN  (NITROSTAT ) 0.4 MG SL tablet, Place 1 tablet (0.4 mg total) under the tongue every 5 (five) minutes as needed for chest pain., Disp: 30 tablet, Rfl: 2   Omega-3 Fatty Acids (FISH OIL CONCENTRATE) 1000 MG CAPS, Take 3 capsules by mouth daily., Disp: ,  Rfl:    pantoprazole  (PROTONIX ) 40 MG tablet, TAKE 1 TABLET BY MOUTH ONCE DAILY BEFORE BREAKFAST, Disp: 90 tablet, Rfl: 0   sucralfate  (CARAFATE ) 1 g tablet, TAKE 1 TABLET BY MOUTH 4 TIMES DAILY WITH MEALS AND AT BEDTIME AS NEEDED FOR ABDOMINAL PAIN, Disp: 30 tablet, Rfl: 0   Medications ordered in this encounter:  Meds ordered this encounter  Medications   Salicylic Acid (SALICYLIC ACID WART REMOVER) 27.5 % LIQD    Sig: Soak warts for 5 minutes in warm water, can file away dead layers if needed following soak, then apply a thin layer of medicine to cover the entire wart, try to avoid healthy skin surrounding. Let dry for 5 minutes then can cover if needed. Repeat twice daily for up to 12 weeks    Dispense:  10 mL    Refill:  0    Supervising Provider:   Corine Dice [[id number]]     *If you need refills on other medications prior to your next appointment, please contact your pharmacy*  Follow-Up: Call back or seek an in-person evaluation if the symptoms worsen or if the condition fails to improve as anticipated.  [facility] [phone]  Other Instructions Plantar Warts Warts are small growths on the skin. When they happen on the bottom of the foot (sole), they are called plantar warts. Plantar warts often form in groups, with several small warts around a larger wart.  They tend to form on the heel or the ball of the foot. They may grow into the deeper layers of skin or rise above the surface of the skin. Most warts are not painful and do not cause problems. In some cases, plantar warts may cause pain when you walk. They can also spread to other parts of the sole or other parts of your body. Warts often go away on their own. Treatment may be done if needed or desired. What are the causes? Plantar warts are caused by a type of virus called human papillomavirus (HPV). You may get the virus if: You walk barefoot. The risk of getting HPV is higher if your feet are wet. You  have a break in the skin of your foot. HPV may attack this break. What increases the risk? You are more likely to get plantar warts if: You are between 64 and 21 years of age. You use public showers or locker rooms. Your body has a weak defense system (immune system). What are the signs or symptoms? Common symptoms of plantar warts include: Flat or slightly raised growths. These may have a rough surface and look like a callus. Pain when you stand or walk on your foot. How is this diagnosed? In many cases, a plantar wart can be diagnosed by a physical exam. In some cases, a biopsy may be taken. This is when a small piece of tissue is removed for testing. How is this treated? In many cases, warts do not need treatment. They may go away on their own with time. If treatment is needed or wanted, it may include: Putting medicated solutions, creams, or patches on the wart. These make the skin soft so that layers will shed away over time. In many cases, the medicine is put on once or twice a day and covered with a bandage. Cryotherapy. This involves freezing the wart with liquid nitrogen. Burning the wart with: Laser treatment. An electrified probe (electrocautery). Injecting a medicine (Candida antigen) into the wart to help the body's immune system fight off the wart. Having surgery to remove the wart. Putting duct tape over the top of the wart (occlusion). You will leave the tape in place for as long as told by your health care provider. Then you will replace it with a new strip of tape. This is done until the wart goes away. Repeat treatment may be needed. In some cases, warts may go away and come back again. Follow these instructions at home: Apply medicated creams or solutions only as told by your provider. You may need to: Soak the affected area in warm water. Remove the top layer of softened skin before you put the medicine on. A pumice stone works well for removing the skin. Put a bandage  over the affected area after you apply the medicine. Repeat the process every day or as told by your provider. Do not scratch or pick at a wart. Wash your hands after you touch a wart. If a wart is painful, try covering it with a bandage that has a hole in the middle. This helps to take pressure off the wart. Keep all follow-up visits. Some treatments may need to be done more than once to get rid of large warts. How is this prevented? To help prevent warts: Wear shoes and socks. Change your socks every day. Keep your feet clean and dry. Do not walk barefoot in shared locker rooms, shower areas, or swimming pools. Check your feet often. Avoid direct  contact with warts on other people. Contact a health care provider if: Your warts do not get better with treatment. You have redness, swelling, or pain at the site of a wart. You have bleeding from a wart that does not stop with light pressure. You have diabetes and you get a wart. This information is not intended to replace advice given to you by your health care provider. Make sure you discuss any questions you have with your health care provider. Document Revised: 05/26/2022 Document Reviewed: 05/26/2022 Elsevier Patient Education  2024 Elsevier Inc.   If you have been instructed to have an in-person evaluation today at a local Urgent Care facility, please use the link below. It will take you to a list of all of our available [facility], including address, phone number and hours of operation. Please do not delay care.  [facility]  If you or a family member do not have a primary care provider, use the link below to schedule a visit and establish care. When you choose a [facility] primary care physician or advanced practice provider, you gain a long-term partner in health. Find a Primary Care Provider  Learn more about [facility]'s in-office and virtual care options: [facility] - Get Care Now

## 2023-11-12 NOTE — Progress Notes (Signed)
 Virtual Visit Consent   [name], you are scheduled for a virtual visit with a [facility] provider today. Just as with appointments in the office, your consent must be obtained to participate. Your consent will be active for this visit and any virtual visit you may have with one of our providers in the next 365 days. If you have a MyChart account, a copy of this consent can be sent to you electronically.  As this is a virtual visit, video technology does not allow for your provider to perform a traditional examination. This may limit your provider's ability to fully assess your condition. If your provider identifies any concerns that need to be evaluated in person or the need to arrange testing (such as labs, EKG, etc.), we will make arrangements to do so. Although advances in technology are sophisticated, we cannot ensure that it will always work on either your end or our end. If the connection with a video visit is poor, the visit may have to be switched to a telephone visit. With either a video or telephone visit, we are not always able to ensure that we have a secure connection.  By engaging in this virtual visit, you consent to the provision of healthcare and authorize for your insurance to be billed (if applicable) for the services provided during this visit. Depending on your insurance coverage, you may receive a charge related to this service.  I need to obtain your verbal consent now. Are you willing to proceed with your visit today? [name] has provided verbal consent on 58/20/2025 for a virtual visit (video or telephone). Angelia Kelp, PA-C  Date: 11/12/2023 8:30 AM   Virtual Visit via Video Note   I, Angelia Kelp, connected with  [name]  ([id number], [birth year]) on 11/12/23 at  8:00 AM EDT by a video-enabled telemedicine application and verified that I am speaking with the correct person using two identifiers.  Location: Patient: Virtual Visit Location  Patient: Home Provider: Virtual Visit Location Provider: Home Office   I discussed the limitations of evaluation and management by telemedicine and the availability of in person appointments. The patient expressed understanding and agreed to proceed.    History of Present Illness: [name] is a 58 y.o. who identifies as a female who was assigned female at birth, and is being seen today for plantar warts. Reports has been on both feet. Tried home remedies and some OTC patches. Left foot improved, but right foot has not.   Problems:  Patient Active Problem List   Diagnosis Date Noted   Chronic recurrent pilonidal cyst 11/09/2022   Positive colorectal cancer screening using Cologuard test    Adenomatous polyp of ascending colon    Elevated hemoglobin A1c 08/03/2017   Chronic right-sided low back pain with right-sided sciatica 07/26/2017   DDD (degenerative disc disease), lumbar 07/26/2017   Lipoma of chest wall 05/27/2017   Hyperlipidemia 04/09/2016   Adjustment disorder 04/09/2016   Nicotine dependence 04/09/2016   Colon cancer screening 04/09/2016   Lump or mass in breast 03/08/2013    Allergies:  Allergies  Allergen Reactions   Cetirizine     Nasal spray Irritated nose   Medications:  Current Outpatient Medications:    Salicylic Acid (SALICYLIC ACID WART REMOVER) 27.5 % LIQD, Soak warts for 5 minutes in warm water, can file away dead layers if needed following soak, then apply a thin layer of medicine to cover the entire wart, try to  avoid healthy skin surrounding. Let dry for 5 minutes then can cover if needed. Repeat twice daily for up to 12 weeks, Disp: 10 mL, Rfl: 0   aspirin 81 MG tablet, Take 81 mg by mouth daily., Disp: , Rfl:    cyclobenzaprine  (FLEXERIL ) 10 MG tablet, Take 1 tablet (10 mg total) by mouth 3 (three) times daily as needed for muscle spasms., Disp: 30 tablet, Rfl: 2   HYDROcodone -acetaminophen  (NORCO/VICODIN) 5-325 MG tablet, Take 1 tablet by mouth  every 6 (six) hours as needed for moderate pain (pain score 4-6)., Disp: 15 tablet, Rfl: 0   ibuprofen  (ADVIL ) 800 MG tablet, Take 1 tablet (800 mg total) by mouth every 8 (eight) hours as needed., Disp: 30 tablet, Rfl: 0   loratadine (CLARITIN) 10 MG tablet, Take 10 mg by mouth daily., Disp: , Rfl:    nitroGLYCERIN  (NITROSTAT ) 0.4 MG SL tablet, Place 1 tablet (0.4 mg total) under the tongue every 5 (five) minutes as needed for chest pain., Disp: 30 tablet, Rfl: 2   Omega-3 Fatty Acids (FISH OIL CONCENTRATE) 1000 MG CAPS, Take 3 capsules by mouth daily., Disp: , Rfl:    pantoprazole  (PROTONIX ) 40 MG tablet, TAKE 1 TABLET BY MOUTH ONCE DAILY BEFORE BREAKFAST, Disp: 90 tablet, Rfl: 0   sucralfate  (CARAFATE ) 1 g tablet, TAKE 1 TABLET BY MOUTH 4 TIMES DAILY WITH MEALS AND AT BEDTIME AS NEEDED FOR ABDOMINAL PAIN, Disp: 30 tablet, Rfl: 0  Observations/Objective: Patient is well-developed, well-nourished in no acute distress.  Resting comfortably at home.  Head is normocephalic, atraumatic.  No labored breathing.  Speech is clear and coherent with logical content.  Patient is alert and oriented at baseline.    Assessment and Plan: 1. Plantar wart (Primary) - Salicylic Acid (SALICYLIC ACID WART REMOVER) 27.5 % LIQD; Soak warts for 5 minutes in warm water, can file away dead layers if needed following soak, then apply a thin layer of medicine to cover the entire wart, try to avoid healthy skin surrounding. Let dry for 5 minutes then can cover if needed. Repeat twice daily for up to 12 weeks  Dispense: 10 mL; Refill: 0  - Will try Salicylic acid solution prescription strength - If fails treatment, discussed being seen in person for cryotherapy or other options that the PCP may have would be best and is sometimes the best treatment for plantar warts  Follow Up Instructions: I discussed the assessment and treatment plan with the patient. The patient was provided an opportunity to ask questions and all  were answered. The patient agreed with the plan and demonstrated an understanding of the instructions.  A copy of instructions were sent to the patient via MyChart unless otherwise noted below.    The patient was advised to call back or seek an in-person evaluation if the symptoms worsen or if the condition fails to improve as anticipated.    Angelia Kelp, PA-C

## 2024-01-03 ENCOUNTER — Ambulatory Visit: Payer: Self-pay

## 2024-01-03 ENCOUNTER — Encounter: Payer: Self-pay | Admitting: Internal Medicine

## 2024-01-03 ENCOUNTER — Ambulatory Visit: Admitting: Internal Medicine

## 2024-01-03 VITALS — BP 102/68 | Ht 64.0 in | Wt 155.0 lb

## 2024-01-03 DIAGNOSIS — R300 Dysuria: Secondary | ICD-10-CM | POA: Diagnosis not present

## 2024-01-03 DIAGNOSIS — N3000 Acute cystitis without hematuria: Secondary | ICD-10-CM

## 2024-01-03 LAB — POCT URINE DIPSTICK
Bilirubin, UA: NEGATIVE
Glucose, UA: NEGATIVE mg/dL
Ketones, POC UA: NEGATIVE mg/dL
Nitrite, UA: NEGATIVE
POC PROTEIN,UA: NEGATIVE
Spec Grav, UA: 1.015 (ref 1.010–1.025)
Urobilinogen, UA: 0.2 U/dL
pH, UA: 7 (ref 5.0–8.0)

## 2024-01-03 MED ORDER — NITROFURANTOIN MONOHYD MACRO 100 MG PO CAPS: 100.0000 mg | ORAL_CAPSULE | Freq: Two times a day (BID) | ORAL | 0 refills | Status: DC

## 2024-01-03 NOTE — Progress Notes (Signed)
 Subjective:    Patient ID: [name], female    DOB: 04-12-1966, 58 y.o.   MRN: [id number]  HPI  Discussed the use of AI scribe software for clinical note transcription with the patient, who gave verbal consent to proceed.  [name] is a 58 year old female who presents with urinary tract infection symptoms.  She has been experiencing a burning sensation and increased frequency of urination for the past two days. No hematuria, fever, chills, nausea, vomiting, or low back pain. She has not taken any over-the-counter medications but has been increasing her water intake.  The symptoms began after she started wearing polyester boxer shorts; she recalls that a previous doctor advised her to wear cotton due to her history of endometriosis. She denies any history of frequent urinary tract infections and has no vaginal symptoms.    Review of Systems   Past Medical History:  Diagnosis Date   Adenomatous polyp of ascending colon    Anginal pain (HCC)    Atypical chest pain    DDD (degenerative disc disease), lumbar    GERD (gastroesophageal reflux disease)    High cholesterol    History of kidney stones    Hyperlipidemia    Pilonidal cyst 10/2022    Current Outpatient Medications  Medication Sig Dispense Refill   aspirin 81 MG tablet Take 81 mg by mouth daily.     cyclobenzaprine  (FLEXERIL ) 10 MG tablet Take 1 tablet (10 mg total) by mouth 3 (three) times daily as needed for muscle spasms. 30 tablet 2   HYDROcodone -acetaminophen  (NORCO/VICODIN) 5-325 MG tablet Take 1 tablet by mouth every 6 (six) hours as needed for moderate pain (pain score 4-6). 15 tablet 0   ibuprofen  (ADVIL ) 800 MG tablet Take 1 tablet (800 mg total) by mouth every 8 (eight) hours as needed. 30 tablet 0   loratadine (CLARITIN) 10 MG tablet Take 10 mg by mouth daily.     nitroGLYCERIN  (NITROSTAT ) 0.4 MG SL tablet Place 1 tablet (0.4 mg total) under the tongue every 5 (five) minutes as needed for chest pain.  30 tablet 2   Omega-3 Fatty Acids (FISH OIL CONCENTRATE) 1000 MG CAPS Take 3 capsules by mouth daily.     pantoprazole  (PROTONIX ) 40 MG tablet TAKE 1 TABLET BY MOUTH ONCE DAILY BEFORE BREAKFAST 90 tablet 0   Salicylic Acid  (SALICYLIC ACID  WART REMOVER) 27.5 % LIQD Soak warts for 5 minutes in warm water, can file away dead layers if needed following soak, then apply a thin layer of medicine to cover the entire wart, try to avoid healthy skin surrounding. Let dry for 5 minutes then can cover if needed. Repeat twice daily for up to 12 weeks 10 mL 0   sucralfate  (CARAFATE ) 1 g tablet TAKE 1 TABLET BY MOUTH 4 TIMES DAILY WITH MEALS AND AT BEDTIME AS NEEDED FOR ABDOMINAL PAIN 30 tablet 0   No current facility-administered medications for this visit.    Allergies  Allergen Reactions   Cetirizine     Nasal spray Irritated nose    Family History  Problem Relation Age of Onset   Diabetes Mother    Breast cancer Sister 63   Diabetes Daughter    Breast cancer Paternal Aunt    Breast cancer Paternal Aunt    Hearing loss Neg Hx    Hypertension Neg Hx    Hyperlipidemia Neg Hx     Social History   Socioeconomic History   Marital status: Divorced  Spouse name: Not on file   Number of children: 4   Years of education: Not on file   Highest education level: 12th grade  Occupational History   Occupation: Holiday representative  Tobacco Use   Smoking status: Former    Current packs/day: 0.00    Average packs/day: 1 pack/day for 30.0 years (30.0 ttl pk-yrs)    Types: Cigarettes, E-cigarettes    Start date: 06/25/1990    Quit date: 06/25/2020    Years since quitting: 3.5    Passive exposure: Past   Smokeless tobacco: Never  Vaping Use   Vaping status: Some Days   Substances: Nicotine  Substance and Sexual Activity   Alcohol use: Yes    Comment: occassional   Drug use: Not Currently    Types: Cocaine    Comment: 6 years ago (2018)   Sexual activity: Not on file  Other Topics Concern   Not on  file  Social History Narrative   Lives in home with daughter   Social Drivers of Health   Financial Resource Strain: Low Risk  (10/26/2022)   Overall Financial Resource Strain (CARDIA)    Difficulty of Paying Living Expenses: Not very hard  Food Insecurity: Food Insecurity Present (10/26/2022)   Hunger Vital Sign    Worried About Running Out of Food in the Last Year: Sometimes true    Ran Out of Food in the Last Year: Sometimes true  Transportation Needs: No Transportation Needs (10/26/2022)   PRAPARE - Administrator, Civil Service (Medical): No    Lack of Transportation (Non-Medical): No  Physical Activity: Sufficiently Active (10/26/2022)   Exercise Vital Sign    Days of Exercise per Week: 3 days    Minutes of Exercise per Session: 50 min  Stress: No Stress Concern Present (10/26/2022)   [employer] of Occupational Health - Occupational Stress Questionnaire    Feeling of Stress : Not at all  Social Connections: Socially Isolated (10/26/2022)   Social Connection and Isolation Panel    Frequency of Communication with Friends and Family: More than three times a week    Frequency of Social Gatherings with Friends and Family: Once a week    Attends Religious Services: Never    Database administrator or Organizations: No    Attends Engineer, structural: Not on file    Marital Status: Divorced  Intimate Partner Violence: Not on file     Constitutional: Denies fever, malaise, fatigue, headache or abrupt weight changes.  Respiratory: Denies difficulty breathing, shortness of breath, cough or sputum production.   Cardiovascular: Denies chest pain, chest tightness, palpitations or swelling in the hands or feet.  Gastrointestinal: Denies abdominal pain, bloating, constipation, diarrhea or blood in the stool.  GU: Patient reports urinary frequency and burning with urination.  Denies urgency, pain with urination, burning sensation, blood in urine, odor or discharge.  No  other specific complaints in a complete review of systems (except as listed in HPI above).      Objective:   Physical Exam  BP 102/68 (BP Location: Left Arm, Patient Position: Sitting, Cuff Size: Normal)   Ht 5' 4 (1.626 m)   Wt 155 lb (70.3 kg)   BMI 26.61 kg/m   Wt Readings from Last 3 Encounters:  04/06/23 159 lb (72.1 kg)  03/25/23 156 lb 9.6 oz (71 kg)  03/19/23 160 lb (72.6 kg)    General: Appears her stated age, overweight, in NAD. Cardiovascular: Normal rate. Pulmonary/Chest: Normal effort. Abdomen:  Soft and nontender. No CVA tenderness noted. Neurological: Alert and oriented.   BMET    Component Value Date/Time   NA 142 09/14/2022 1618   NA 142 08/02/2017 0949   K 4.4 09/14/2022 1618   CL 106 09/14/2022 1618   CO2 30 09/14/2022 1618   GLUCOSE 88 09/14/2022 1618   BUN 14 09/14/2022 1618   BUN 17 08/02/2017 0949   CREATININE 0.81 09/14/2022 1618   CALCIUM 9.5 09/14/2022 1618   GFRNONAA 105 07/24/2019 0849   GFRAA 122 07/24/2019 0849    Lipid Panel     Component Value Date/Time   CHOL 187 09/14/2022 1618   CHOL 203 (H) 08/02/2017 0949   TRIG 438 (H) 09/14/2022 1618   HDL 34 (L) 09/14/2022 1618   HDL 31 (L) 08/02/2017 0949   CHOLHDL 5.5 (H) 09/14/2022 1618   VLDL 42 (H) 04/09/2016 0001   LDLCALC  09/14/2022 1618     Comment:     . LDL cholesterol not calculated. Triglyceride levels greater than 400 mg/dL invalidate calculated LDL results. . Reference range: <100 . Desirable range <100 mg/dL for primary prevention;   <70 mg/dL for patients with CHD or diabetic patients  with > or = 2 CHD risk factors. [facility] LDL-C is now calculated using the Martin-Hopkins  calculation, which is a validated novel method providing  better accuracy than the Friedewald equation in the  estimation of LDL-C.  Gladis APPLETHWAITE et al. SANDREA. 7986;689(80): 2061-2068  (http://education.[facility].com/faq/FAQ164)     CBC    Component Value Date/Time   WBC 8.4 09/14/2022  1618   RBC 4.59 09/14/2022 1618   HGB 14.5 09/14/2022 1618   HGB 14.4 08/02/2017 0949   HCT 42.6 09/14/2022 1618   HCT 42.0 08/02/2017 0949   PLT 214 09/14/2022 1618   PLT 199 08/02/2017 0949   MCV 92.8 09/14/2022 1618   MCV 94 08/02/2017 0949   MCH 31.6 09/14/2022 1618   MCHC 34.0 09/14/2022 1618   RDW 11.7 09/14/2022 1618   RDW 12.6 08/02/2017 0949   LYMPHSABS 2,671 09/14/2022 1618   EOSABS 59 09/14/2022 1618   BASOSABS 34 09/14/2022 1618    Hgb A1C Lab Results  Component Value Date   HGBA1C 5.6 09/14/2022            Assessment & Plan:   Assessment and Plan    Urinary tract infection Acute UTI with dysuria and frequency. Urinalysis shows moderate blood and leukocytes. Awaiting culture results. - Prescribed Macrobid  100 mg BID for 5 days. - Sent urine culture for confirmation. - Advised increased fluid intake. - Instructed to take AZO OTC if severe dysuria. - Follow-up via MyChart on Wednesday to adjust antibiotics based on culture.     Follow-up with your PCP as previously scheduled Angeline Laura, NP

## 2024-01-03 NOTE — Telephone Encounter (Signed)
 Will discuss at upcoming appointment

## 2024-01-03 NOTE — Patient Instructions (Signed)

## 2024-01-03 NOTE — Telephone Encounter (Signed)
 FYI Only or Action Required?: FYI only for provider.  Patient was last seen in primary care on 03/19/2023 by Edman Marsa PARAS, DO.  Called Nurse Triage reporting Urinary Frequency.  Symptoms began 2 days ago.  Interventions attempted: Rest, hydration, or home remedies.  Symptoms are: gradually worsening.  Triage Disposition: See Physician Within 24 Hours  Patient/caregiver understands and will follow disposition?: Yes                             Copied from CRM #[id number]. Topic: Clinical - Red Word Triage >> Jan 03, 2024  8:45 AM Pinkey ORN wrote: Red Word that prompted transfer to Nurse Triage: UTI >> Jan 03, 2024  8:45 AM Pinkey ORN wrote: Patient states she's been experiencing symptoms for about 2 days now.  Reason for Disposition  All other patients with painful urination  (Exception: [1] EITHER frequency or urgency AND [2] has on-call doctor.)  Answer Assessment - Initial Assessment Questions 1. SEVERITY: How bad is the pain?  (e.g., Scale 1-10; mild, moderate, or severe)     Rates pain a 5 3. PATTERN: Is pain present every time you urinate or just sometimes?      Every time 4. ONSET: When did the painful urination start?      2 days ago  5. FEVER: Do you have a fever? If Yes, ask: What is your temperature, how was it measured, and when did it start?     Denies 6. PAST UTI: Have you had a urine infection before? If Yes, ask: When was the last time? and What happened that time?      Yes, before getting a hysterectomy  7. CAUSE: What do you think is causing the painful urination?  (e.g., UTI, scratch, Herpes sore)     UTI 8. OTHER SYMPTOMS: Do you have any other symptoms? (e.g., blood in urine, flank pain, genital sores, urgency, vaginal discharge)    Urinary frequency, urinary urgency, denies hematuria, denies flank pain    Offered availability with PCP this morning. Patient declined and requested a later  appointment because she lives far away. Scheduled with alternate provider in office today.  Protocols used: Urination Pain - Female-A-AH

## 2024-01-06 ENCOUNTER — Ambulatory Visit: Payer: Self-pay | Admitting: Internal Medicine

## 2024-01-06 LAB — URINE CULTURE
MICRO NUMBER:: 16814926
SPECIMEN QUALITY:: ADEQUATE

## 2024-01-21 ENCOUNTER — Other Ambulatory Visit: Payer: Self-pay | Admitting: Family Medicine

## 2024-01-21 DIAGNOSIS — R1013 Epigastric pain: Secondary | ICD-10-CM

## 2024-01-21 DIAGNOSIS — K219 Gastro-esophageal reflux disease without esophagitis: Secondary | ICD-10-CM

## 2024-01-23 NOTE — Telephone Encounter (Signed)
 Unable to refill per protocol, courtesy refill already given, OV needed for additional refills.  Requested Prescriptions  Pending Prescriptions Disp Refills   pantoprazole  (PROTONIX ) 40 MG tablet [Pharmacy Med Name: Pantoprazole  Sodium 40 MG Oral Tablet Delayed Release] 90 tablet 0    Sig: TAKE 1 TABLET BY MOUTH ONCE DAILY BEFORE BREAKFAST     Gastroenterology: Proton Pump Inhibitors Failed - 01/23/2024 10:49 AM      Failed - Valid encounter within last 12 months    Recent Outpatient Visits           2 weeks ago Acute cystitis without hematuria   [place] [facility] [place], Angeline ORN, [place]

## 2024-02-02 ENCOUNTER — Other Ambulatory Visit: Payer: Self-pay | Admitting: Family Medicine

## 2024-02-02 DIAGNOSIS — Z1231 Encounter for screening mammogram for malignant neoplasm of breast: Secondary | ICD-10-CM

## 2024-03-14 ENCOUNTER — Other Ambulatory Visit: Payer: Self-pay | Admitting: Family Medicine

## 2024-03-14 ENCOUNTER — Encounter: Payer: Self-pay | Admitting: Family Medicine

## 2024-03-14 ENCOUNTER — Ambulatory Visit (INDEPENDENT_AMBULATORY_CARE_PROVIDER_SITE_OTHER): Admitting: Family Medicine

## 2024-03-14 VITALS — BP 124/68 | HR 74 | Ht 64.0 in | Wt 161.5 lb

## 2024-03-14 DIAGNOSIS — E538 Deficiency of other specified B group vitamins: Secondary | ICD-10-CM

## 2024-03-14 DIAGNOSIS — K219 Gastro-esophageal reflux disease without esophagitis: Secondary | ICD-10-CM

## 2024-03-14 DIAGNOSIS — E559 Vitamin D deficiency, unspecified: Secondary | ICD-10-CM

## 2024-03-14 DIAGNOSIS — Z0000 Encounter for general adult medical examination without abnormal findings: Secondary | ICD-10-CM | POA: Diagnosis not present

## 2024-03-14 DIAGNOSIS — R1013 Epigastric pain: Secondary | ICD-10-CM | POA: Diagnosis not present

## 2024-03-14 DIAGNOSIS — R7309 Other abnormal glucose: Secondary | ICD-10-CM

## 2024-03-14 DIAGNOSIS — E781 Pure hyperglyceridemia: Secondary | ICD-10-CM

## 2024-03-14 NOTE — Patient Instructions (Addendum)
 Thank you for coming to the office today.  [employer] Peroxyl Oral Debridement Mouthwash  Carafate  or Protonix  refills in future if you need, contact me.  DUE for FASTING BLOOD WORK (no food or drink after midnight before the lab appointment, only water or coffee without cream/sugar on the morning of)  SCHEDULE Lab Only visit in the morning at the clinic for lab draw in 1 YEAR  - Make sure Lab Only appointment is at about 1 week before your next appointment, so that results will be available  For Lab Results, once available within 2-3 days of blood draw, you can can log in to MyChart online to view your results and a brief explanation. Also, we can discuss results at next follow-up visit.   Please schedule a Follow-up Appointment to: Return for 1 year fasting lab > 1 week later Annual Physical.  If you have any other questions or concerns, please feel free to call the office or send a message through MyChart. You may also schedule an earlier appointment if necessary.  Additionally, you may be receiving a survey about your experience at our office within a few days to 1 week by e-mail or mail. We value your feedback.  Marsa Officer, DO [facility], [place]

## 2024-03-14 NOTE — Progress Notes (Signed)
 Subjective:    Patient ID: [name], female    DOB: 08/29/1965, 58 y.o.   MRN: [id number]  [name] is a 58 y.o. female presenting on 03/14/2024 for Annual Exam   HPI  Discussed the use of AI scribe software for clinical note transcription with the patient, who gave verbal consent to proceed.  History of Present Illness   [name] is a 58 year old female who presents for an annual physical exam and blood work.    Oral and dental pain - Dental appointment scheduled for March 22, 2024, for infected gum related to a dental screw - Managing gum pain for two weeks - Considering use of Peroxyl mouthwash for healing  Allergic symptoms - Takes Claritin for allergy symptoms - No current nasal congestion, sneezing, or itchy eyes  Elevated A1c Previously, 5 range due for repeat lab   HYPERLIPIDEMIA: - Reports no concerns. Last lipid panel 2022 mostly normal with total cholesterol, improved TG, improved LDL at 124. - Not on cholesterol medicine   Former Smoker Quit 06/25/20   GERD History of PUD Takes Pantoprazole  40mg  intermittently not every day Has carafate  AS NEEDED if need.   Husband passed in 07/2023, major stressor She has support system with grandchildren and her children and coworkers  She had some elevated BP with anxiety     Health Maintenance:   Prevnar 20 future consideration   Decline Lung CA Screening   Mammogram 02/2023 negative, repeat 03/20/24 scheduled.   Colonoscopy 05/27/21, repeat 5 years, or 2028     03/14/2024    1:40 PM 01/03/2024   11:04 AM 03/19/2023    1:37 PM  Depression screen PHQ 2/9  Decreased Interest 0 0 0  Down, Depressed, Hopeless 1 0 0  PHQ - 2 Score 1 0 0  Altered sleeping 0 0 0  Tired, decreased energy 0 0 0  Change in appetite 0 0 0  Feeling bad or failure about yourself  1 0 0  Trouble concentrating 0 0 0  Moving slowly or fidgety/restless 0 0 0  Suicidal thoughts 0 0 0  PHQ-9 Score 2 0 0  Difficult  doing work/chores Not difficult at all Not difficult at all Not difficult at all       03/14/2024    1:41 PM 01/03/2024   11:05 AM 03/19/2023    1:37 PM 09/14/2022    3:38 PM  GAD 7 : Generalized Anxiety Score  Nervous, Anxious, on Edge 0 0 0 0  Control/stop worrying 1 0 0 0  Worry too much - different things 1 0 0 1  Trouble relaxing 0 0 0 0  Restless 0 0 0 0  Easily annoyed or irritable 1 0 0 0  Afraid - awful might happen 0 0 0 0  Total GAD 7 Score 3 0 0 1  Anxiety Difficulty Not difficult at all Not difficult at all Not difficult at all      Past Medical History:  Diagnosis Date   Adenomatous polyp of ascending colon    Allergy    Anginal pain    Atypical chest pain    DDD (degenerative disc disease), lumbar    GERD (gastroesophageal reflux disease)    High cholesterol    History of kidney stones    Hyperlipidemia    Pilonidal cyst 10/2022   Past Surgical History:  Procedure Laterality Date   ABDOMINAL HYSTERECTOMY  2000   BREAST BIOPSY Right 01/14/2012  core/clip neg   COLONOSCOPY WITH PROPOFOL  N/A 05/27/2021   Procedure: COLONOSCOPY WITH PROPOFOL ;  Surgeon: Unk Corinn Skiff, MD;  Location: [facility] ENDOSCOPY;  Service: Gastroenterology;  Laterality: N/A;   PILONIDAL CYST EXCISION N/A 11/09/2022   Procedure: CYST EXCISION;  Surgeon: Lane Shope, MD;  Location: [facility] ORS;  Service: General;  Laterality: N/A;   PILONIDAL CYST EXCISION N/A 03/01/2023   Procedure: CYST EXCISION PILONIDAL EXTENSIVE;  Surgeon: Lane Shope, MD;  Location: [facility] ORS;  Service: General;  Laterality: N/A;   Social History   Socioeconomic History   Marital status: Divorced    Spouse name: Not on file   Number of children: 4   Years of education: Not on file   Highest education level: 12th grade  Occupational History   Occupation: Holiday representative  Tobacco Use   Smoking status: Former    Current packs/day: 0.00    Average packs/day: 1 pack/day for 30.0 years (30.0 ttl pk-yrs)     Types: Cigarettes, E-cigarettes    Start date: 06/25/1990    Quit date: 06/25/2020    Years since quitting: 3.7    Passive exposure: Past   Smokeless tobacco: Never  Vaping Use   Vaping status: Some Days   Substances: Nicotine  Substance and Sexual Activity   Alcohol use: Yes    Comment: occassional   Drug use: Not Currently    Types: Cocaine    Comment: 6 years ago (2018)   Sexual activity: Not on file  Other Topics Concern   Not on file  Social History Narrative   Lives in home with daughter   Social Drivers of Health   Financial Resource Strain: Low Risk  (10/26/2022)   Overall Financial Resource Strain (CARDIA)    Difficulty of Paying Living Expenses: Not very hard  Food Insecurity: Food Insecurity Present (10/26/2022)   Hunger Vital Sign    Worried About Running Out of Food in the Last Year: Sometimes true    Ran Out of Food in the Last Year: Sometimes true  Transportation Needs: No Transportation Needs (10/26/2022)   PRAPARE - Administrator, Civil Service (Medical): No    Lack of Transportation (Non-Medical): No  Physical Activity: Sufficiently Active (10/26/2022)   Exercise Vital Sign    Days of Exercise per Week: 3 days    Minutes of Exercise per Session: 50 min  Stress: No Stress Concern Present (10/26/2022)   [employer] of Occupational Health - Occupational Stress Questionnaire    Feeling of Stress : Not at all  Social Connections: Socially Isolated (10/26/2022)   Social Connection and Isolation Panel    Frequency of Communication with Friends and Family: More than three times a week    Frequency of Social Gatherings with Friends and Family: Once a week    Attends Religious Services: Never    Database administrator or Organizations: No    Attends Engineer, structural: Not on file    Marital Status: Divorced  Catering manager Violence: Not on file   Family History  Problem Relation Age of Onset   Diabetes Mother    Breast cancer Sister 68    Diabetes Daughter    Breast cancer Paternal Aunt    Breast cancer Paternal Aunt    Hearing loss Neg Hx    Hypertension Neg Hx    Hyperlipidemia Neg Hx    Current Outpatient Medications on File Prior to Visit  Medication Sig   aspirin 81 MG tablet Take 81 mg  by mouth daily.   ibuprofen  (ADVIL ) 800 MG tablet Take 1 tablet (800 mg total) by mouth every 8 (eight) hours as needed.   loratadine (CLARITIN) 10 MG tablet Take 10 mg by mouth daily.   nitroGLYCERIN  (NITROSTAT ) 0.4 MG SL tablet Place 1 tablet (0.4 mg total) under the tongue every 5 (five) minutes as needed for chest pain.   pantoprazole  (PROTONIX ) 40 MG tablet TAKE 1 TABLET BY MOUTH ONCE DAILY BEFORE BREAKFAST   sucralfate  (CARAFATE ) 1 g tablet TAKE 1 TABLET BY MOUTH 4 TIMES DAILY WITH MEALS AND AT BEDTIME AS NEEDED FOR ABDOMINAL PAIN   No current facility-administered medications on file prior to visit.    Review of Systems  Constitutional:  Negative for activity change, appetite change, chills, diaphoresis, fatigue and fever.  HENT:  Negative for congestion and hearing loss.   Eyes:  Negative for visual disturbance.  Respiratory:  Negative for cough, chest tightness, shortness of breath and wheezing.   Cardiovascular:  Negative for chest pain, palpitations and leg swelling.  Gastrointestinal:  Negative for abdominal pain, constipation, diarrhea, nausea and vomiting.  Genitourinary:  Negative for dysuria, frequency and hematuria.  Musculoskeletal:  Negative for arthralgias and neck pain.  Skin:  Negative for rash.  Neurological:  Negative for dizziness, weakness, light-headedness, numbness and headaches.  Hematological:  Negative for adenopathy.  Psychiatric/Behavioral:  Negative for behavioral problems, dysphoric mood and sleep disturbance.    Per HPI unless specifically indicated above     Objective:    BP 124/68 (BP Location: Left Arm, Patient Position: Sitting, Cuff Size: Normal)   Pulse 74   Ht 5' 4 (1.626 m)   Wt  161 lb 8 oz (73.3 kg)   SpO2 96%   BMI 27.72 kg/m   Wt Readings from Last 3 Encounters:  03/14/24 161 lb 8 oz (73.3 kg)  01/03/24 155 lb (70.3 kg)  04/06/23 159 lb (72.1 kg)    Physical Exam Vitals and nursing note reviewed.  Constitutional:      General: She is not in acute distress.    Appearance: She is well-developed. She is not diaphoretic.     Comments: Well-appearing, comfortable, cooperative  HENT:     Head: Normocephalic and atraumatic.  Eyes:     General:        Right eye: No discharge.        Left eye: No discharge.     Conjunctiva/sclera: Conjunctivae normal.     Pupils: Pupils are equal, round, and reactive to light.  Neck:     Thyroid: No thyromegaly.     Vascular: No carotid bruit.  Cardiovascular:     Rate and Rhythm: Normal rate and regular rhythm.     Pulses: Normal pulses.     Heart sounds: Normal heart sounds. No murmur heard. Pulmonary:     Effort: Pulmonary effort is normal. No respiratory distress.     Breath sounds: Normal breath sounds. No wheezing or rales.  Abdominal:     General: Bowel sounds are normal. There is no distension.     Palpations: Abdomen is soft. There is no mass.     Tenderness: There is no abdominal tenderness.  Musculoskeletal:        General: No tenderness. Normal range of motion.     Cervical back: Normal range of motion and neck supple.     Right lower leg: No edema.     Left lower leg: No edema.     Comments: Upper / Lower Extremities: -  Normal muscle tone, strength bilateral upper extremities 5/5, lower extremities 5/5  Lymphadenopathy:     Cervical: No cervical adenopathy.  Skin:    General: Skin is warm and dry.     Findings: No erythema or rash.  Neurological:     Mental Status: She is alert and oriented to person, place, and time.     Comments: Distal sensation intact to light touch all extremities  Psychiatric:        Mood and Affect: Mood normal.        Behavior: Behavior normal.        Thought Content:  Thought content normal.     Comments: Well groomed, good eye contact, normal speech and thoughts     Results for orders placed or performed in visit on 01/03/24  POCT URINE DIPSTICK   Collection Time: 01/03/24 11:03 AM  Result Value Ref Range   Color, UA yellow yellow   Clarity, UA cloudy (A) clear   Glucose, UA negative negative mg/dL   Bilirubin, UA negative negative   Ketones, POC UA negative negative mg/dL   Spec Grav, UA 8.984 8.989 - 1.025   Blood, UA moderate (A) negative   pH, UA 7.0 5.0 - 8.0   POC PROTEIN,UA negative negative, trace   Urobilinogen, UA 0.2 0.2 or 1.0 E.U./dL   Nitrite, UA Negative Negative   Leukocytes, UA Moderate (2+) (A) Negative  Urine Culture   Collection Time: 01/03/24 11:14 AM   Specimen: Urine  Result Value Ref Range   MICRO NUMBER: [id number]    SPECIMEN QUALITY: Adequate    Sample Source URINE    STATUS: FINAL    ISOLATE 1: Escherichia coli (A)       Susceptibility   Escherichia coli - URINE CULTURE, REFLEX    AMOX/CLAVULANIC 16 Intermediate     AMPICILLIN/SULBACTAM 16 Intermediate     CEFAZOLIN  >=32 Resistant     CEFTAZIDIME >=32 Resistant     CEFEPIME 16 Resistant     CEFTRIAXONE >=64 Resistant     CIPROFLOXACIN >=4 Resistant     LEVOFLOXACIN >=8 Resistant     GENTAMICIN <=1 Sensitive     IMIPENEM <=0.25 Sensitive     MEROPENEM <=0.25 Sensitive     NITROFURANTOIN  <=16 Sensitive     PIP/TAZO <=4 Sensitive     TRIMETH/SULFA* <=20 Sensitive      * Legend: S = Susceptible  I = Intermediate R = Resistant  NS = Not susceptible SDD = Susceptible Dose Dependent * = Not Tested  NR = Not Reported **NN = See Therapy Comments       Assessment & Plan:   Problem List Items Addressed This Visit   None Visit Diagnoses       Annual physical exam    -  Primary     Epigastric pain         Gastroesophageal reflux disease without esophagitis            Updated Health Maintenance information Reviewed recent lab results with  patient Encouraged improvement to lifestyle with diet and exercise Goal of weight loss  Oral mucosal irritation due to dental implant Irritation and soreness around dental implant screws. - Recommend OTC [employer] Peroxyl mouthwash for oral debridement and healing.  Gastroesophageal reflux disease GERD managed with Protonix  and dietary modifications.  - Continue Protonix  40mg  daily AS NEEDED and dietary modifications. And Carafate  used as needed. She can contact us  for refills on Protonix  and Carafate   Elevated cholesterol Cholesterol  levels mildly elevated with ongoing monitoring. - Include cholesterol levels in upcoming blood work.  Weight fluctuation Weight stable within 155-161 lbs range. - Continue monitoring weight trends.  Blood pressure monitoring Blood pressure well-controlled at 124/68 mmHg with home monitoring. - Continue home blood pressure monitoring.  Adult Wellness Visit Annual wellness visit completed. Blood pressure 124/68 mmHg. Weight stable. HbA1c 5.6%. Cholesterol mildly elevated. No mood, anxiety, or sleep concerns. Good support system and stress management. - Schedule blood work for 03/21/2024, including thyroid, cholesterol, sugar, blood count, kidney, liver, and optional vitamin D  and B12 tests. - Schedule next annual wellness visit for next year.       No orders of the defined types were placed in this encounter.   No orders of the defined types were placed in this encounter.    Follow up plan: Return for 1 year fasting lab > 1 week later Annual Physical.  Future labs 03/09/2025  CMET, CBC, TSH, Lipid, A1c, add Vitamin D  and B12   Marsa Officer, DO [facility] 03/14/2024, 1:32 PM

## 2024-03-20 ENCOUNTER — Ambulatory Visit
Admission: RE | Admit: 2024-03-20 | Discharge: 2024-03-20 | Disposition: A | Discharge status: Home / Self Care | Source: Ambulatory Visit | Attending: Family Medicine | Admitting: Family Medicine

## 2024-03-20 DIAGNOSIS — Z1231 Encounter for screening mammogram for malignant neoplasm of breast: Secondary | ICD-10-CM | POA: Insufficient documentation

## 2024-03-21 ENCOUNTER — Other Ambulatory Visit

## 2024-03-21 DIAGNOSIS — E538 Deficiency of other specified B group vitamins: Secondary | ICD-10-CM

## 2024-03-21 DIAGNOSIS — R7309 Other abnormal glucose: Secondary | ICD-10-CM

## 2024-03-21 DIAGNOSIS — E781 Pure hyperglyceridemia: Secondary | ICD-10-CM

## 2024-03-21 DIAGNOSIS — Z0000 Encounter for general adult medical examination without abnormal findings: Secondary | ICD-10-CM

## 2024-03-21 DIAGNOSIS — E559 Vitamin D deficiency, unspecified: Secondary | ICD-10-CM

## 2024-03-22 LAB — COMPREHENSIVE METABOLIC PANEL WITH GFR
AG Ratio: 1.6 (calc) (ref 1.0–2.5)
ALT: 17 U/L (ref 6–29)
AST: 15 U/L (ref 10–35)
Albumin: 4.2 g/dL (ref 3.6–5.1)
Alkaline phosphatase (APISO): 95 U/L (ref 37–153)
BUN: 17 mg/dL (ref 7–25)
CO2: 27 mmol/L (ref 20–32)
Calcium: 9.4 mg/dL (ref 8.6–10.4)
Chloride: 105 mmol/L (ref 98–110)
Creat: 0.83 mg/dL (ref 0.50–1.03)
Globulin: 2.6 g/dL (ref 1.9–3.7)
Glucose, Bld: 109 mg/dL — ABNORMAL HIGH (ref 65–99)
Potassium: 4.3 mmol/L (ref 3.5–5.3)
Sodium: 140 mmol/L (ref 135–146)
Total Bilirubin: 0.4 mg/dL (ref 0.2–1.2)
Total Protein: 6.8 g/dL (ref 6.1–8.1)
eGFR: 82 mL/min/1.73m2 (ref >=60)

## 2024-03-22 LAB — CBC WITH DIFFERENTIAL/PLATELET
Absolute Lymphocytes: 1866 {cells}/uL (ref 850–3900)
Absolute Monocytes: 408 {cells}/uL (ref 200–950)
Basophils Absolute: 30 {cells}/uL (ref 0–200)
Basophils Relative: 0.5 %
Eosinophils Absolute: 138 {cells}/uL (ref 15–500)
Eosinophils Relative: 2.3 %
HCT: 44.3 % (ref 35.0–45.0)
Hemoglobin: 14.6 g/dL (ref 11.7–15.5)
MCH: 31.3 pg (ref 27.0–33.0)
MCHC: 33 g/dL (ref 32.0–36.0)
MCV: 94.9 fL (ref 80.0–100.0)
MPV: 12.6 fL — ABNORMAL HIGH (ref 7.5–12.5)
Monocytes Relative: 6.8 %
Neutro Abs: 3558 {cells}/uL (ref 1500–7800)
Neutrophils Relative %: 59.3 %
Platelets: 171 Thousand/uL (ref 140–400)
RBC: 4.67 Million/uL (ref 3.80–5.10)
RDW: 12.5 % (ref 11.0–15.0)
Total Lymphocyte: 31.1 %
WBC: 6 Thousand/uL (ref 3.8–10.8)

## 2024-03-22 LAB — TSH: TSH: 2.1 m[IU]/L (ref 0.40–4.50)

## 2024-03-22 LAB — VITAMIN D 25 HYDROXY (VIT D DEFICIENCY, FRACTURES): Vit D, 25-Hydroxy: 32 ng/mL (ref 30–100)

## 2024-03-22 LAB — LIPID PANEL
Cholesterol: 197 mg/dL (ref <200)
HDL: 33 mg/dL — ABNORMAL LOW (ref >=50)
LDL Cholesterol (Calc): 127 mg/dL — ABNORMAL HIGH
Non-HDL Cholesterol (Calc): 164 mg/dL — ABNORMAL HIGH (ref <130)
Total CHOL/HDL Ratio: 6 (calc) — ABNORMAL HIGH (ref <5.0)
Triglycerides: 228 mg/dL — ABNORMAL HIGH (ref <150)

## 2024-03-22 LAB — HEMOGLOBIN A1C
Hgb A1c MFr Bld: 5.5 % (ref <5.7)
Mean Plasma Glucose: 111 mg/dL
eAG (mmol/L): 6.2 mmol/L

## 2024-03-22 LAB — VITAMIN B12: Vitamin B-12: 236 pg/mL (ref 200–1100)

## 2024-03-23 ENCOUNTER — Ambulatory Visit: Payer: Self-pay | Admitting: Family Medicine

## 2024-03-23 DIAGNOSIS — E538 Deficiency of other specified B group vitamins: Secondary | ICD-10-CM

## 2024-03-24 MED ORDER — CYANOCOBALAMIN 1000 MCG/ML IJ SOLN
1000.0000 ug | INTRAMUSCULAR | 0 refills | Status: DC
Start: 2024-03-24 — End: 2024-04-19

## 2024-03-30 ENCOUNTER — Telehealth: Payer: Self-pay

## 2024-03-30 ENCOUNTER — Ambulatory Visit (INDEPENDENT_AMBULATORY_CARE_PROVIDER_SITE_OTHER)

## 2024-03-30 DIAGNOSIS — E538 Deficiency of other specified B group vitamins: Secondary | ICD-10-CM | POA: Diagnosis not present

## 2024-03-30 MED ORDER — CYANOCOBALAMIN 1000 MCG/ML IJ SOLN
1000.0000 ug | Freq: Once | INTRAMUSCULAR | Status: AC
Start: 2024-03-30 — End: 2024-03-30
  Administered 2024-03-30: 1000 ug via INTRAMUSCULAR

## 2024-03-30 MED ORDER — SYRINGE/NEEDLE (DISP) 25G X 1" 1 ML MISC
0 refills | Status: AC
Start: 2024-03-30 — End: ?

## 2024-03-30 NOTE — Telephone Encounter (Signed)
 Patient would like to do B12 injections at home and not come here for the next 3 weeks. Can we call supplies into pharmacy [facility] in [place]

## 2024-03-30 NOTE — Telephone Encounter (Signed)
 Sure. That is fine if she is comfortable with it. I have sent rx for syringe/needle to her pharmacy  Can you remind her that after finishing the first round of 1 month B12 (3 more weeks), she should contact us  back for new order on B12 monthly for 4 months? She can call or message to request the monthly order.  Marsa Officer, DO [facility] 03/30/2024, 1:31 PM

## 2024-03-30 NOTE — Addendum Note (Signed)
 Addended by: EDMAN MARSA PARAS on: 03/30/2024 01:32 PM   Modules accepted: Orders

## 2024-04-12 ENCOUNTER — Other Ambulatory Visit: Payer: Self-pay | Admitting: Family Medicine

## 2024-04-12 DIAGNOSIS — R1013 Epigastric pain: Secondary | ICD-10-CM

## 2024-04-13 ENCOUNTER — Other Ambulatory Visit: Payer: Self-pay

## 2024-04-13 DIAGNOSIS — R1013 Epigastric pain: Secondary | ICD-10-CM

## 2024-04-13 MED ORDER — SUCRALFATE 1 G PO TABS: 1.0000 g | ORAL_TABLET | Freq: Three times a day (TID) | ORAL | 0 refills | Status: AC

## 2024-04-14 NOTE — Telephone Encounter (Signed)
 Requested Prescriptions  Refused Prescriptions Disp Refills   sucralfate  (CARAFATE ) 1 g tablet [Pharmacy Med Name: Sucralfate  1 GM Oral Tablet] 30 tablet 0    Sig: TAKE 1 TABLET BY MOUTH 4 TIMES DAILY WITH MEALS AND AT BEDTIME AS NEEDED FOR ABDOMINAL PAIN     Gastroenterology: Antiacids Passed - 04/14/2024  4:23 PM      Passed - Valid encounter within last 12 months    Recent Outpatient Visits           1 month ago Annual physical exam   [place] [facility] [place], Marsa PARAS, DO   3 months ago Acute cystitis without hematuria   [place] [facility] [place], Angeline [place], [place]

## 2024-04-19 MED ORDER — CYANOCOBALAMIN 1000 MCG/ML IJ SOLN: 1000.0000 ug | INTRAMUSCULAR | 0 refills | Status: AC

## 2024-04-19 NOTE — Addendum Note (Signed)
 Addended by: EDMAN MARSA PARAS on: 04/19/2024 05:03 PM   Modules accepted: Orders

## 2025-03-09 ENCOUNTER — Other Ambulatory Visit

## 2025-03-16 ENCOUNTER — Encounter: Admitting: Family Medicine
